# Patient Record
Sex: Male | Born: 1975 | Race: White | Hispanic: No | Marital: Married | Smoking: Heavy tobacco smoker
Health system: Southern US, Community
[De-identification: ages and names within clinical notes are randomized; demographics above are authoritative.]

## PROBLEM LIST (undated history)

## (undated) DIAGNOSIS — F419 Anxiety disorder, unspecified: Secondary | ICD-10-CM

## (undated) DIAGNOSIS — D649 Anemia, unspecified: Secondary | ICD-10-CM

## (undated) DIAGNOSIS — E0590 Thyrotoxicosis, unspecified without thyrotoxic crisis or storm: Secondary | ICD-10-CM

## (undated) DIAGNOSIS — R519 Headache, unspecified: Secondary | ICD-10-CM

## (undated) DIAGNOSIS — Z79899 Other long term (current) drug therapy: Secondary | ICD-10-CM

## (undated) DIAGNOSIS — F32A Depression, unspecified: Secondary | ICD-10-CM

## (undated) DIAGNOSIS — F329 Major depressive disorder, single episode, unspecified: Secondary | ICD-10-CM

## (undated) DIAGNOSIS — E119 Type 2 diabetes mellitus without complications: Secondary | ICD-10-CM

## (undated) DIAGNOSIS — R51 Headache: Secondary | ICD-10-CM

## (undated) DIAGNOSIS — M5136 Other intervertebral disc degeneration, lumbar region: Secondary | ICD-10-CM

## (undated) DIAGNOSIS — Z889 Allergy status to unspecified drugs, medicaments and biological substances status: Secondary | ICD-10-CM

## (undated) DIAGNOSIS — F319 Bipolar disorder, unspecified: Secondary | ICD-10-CM

## (undated) DIAGNOSIS — K219 Gastro-esophageal reflux disease without esophagitis: Secondary | ICD-10-CM

## (undated) DIAGNOSIS — M84376D Stress fracture, unspecified foot, subsequent encounter for fracture with routine healing: Secondary | ICD-10-CM

## (undated) DIAGNOSIS — E669 Obesity, unspecified: Secondary | ICD-10-CM

## (undated) DIAGNOSIS — G8929 Other chronic pain: Secondary | ICD-10-CM

## (undated) DIAGNOSIS — R251 Tremor, unspecified: Secondary | ICD-10-CM

## (undated) DIAGNOSIS — M1990 Unspecified osteoarthritis, unspecified site: Secondary | ICD-10-CM

## (undated) DIAGNOSIS — M797 Fibromyalgia: Secondary | ICD-10-CM

## (undated) DIAGNOSIS — G5620 Lesion of ulnar nerve, unspecified upper limb: Secondary | ICD-10-CM

## (undated) DIAGNOSIS — Z9884 Bariatric surgery status: Secondary | ICD-10-CM

## (undated) DIAGNOSIS — H9190 Unspecified hearing loss, unspecified ear: Secondary | ICD-10-CM

## (undated) DIAGNOSIS — J45909 Unspecified asthma, uncomplicated: Secondary | ICD-10-CM

## (undated) HISTORY — PX: NEUROPLASTY / TRANSPOSITION MEDIAN NERVE AT CARPAL TUNNEL: SUR893

## (undated) HISTORY — PX: HERNIA REPAIR: SHX51

## (undated) HISTORY — PX: OTHER SURGICAL HISTORY: SHX169

## (undated) HISTORY — PX: LAPAROSCOPIC GASTRIC BYPASS: SUR771

---

## 2000-11-08 HISTORY — PX: OTHER SURGICAL HISTORY: SHX169

## 2004-08-11 ENCOUNTER — Ambulatory Visit: Payer: Self-pay | Admitting: Pain Medicine

## 2004-08-19 ENCOUNTER — Ambulatory Visit: Payer: Self-pay | Admitting: Pain Medicine

## 2004-09-29 ENCOUNTER — Ambulatory Visit: Payer: Self-pay | Admitting: Pain Medicine

## 2004-11-25 ENCOUNTER — Ambulatory Visit: Payer: Self-pay | Admitting: Pain Medicine

## 2004-12-17 ENCOUNTER — Ambulatory Visit: Payer: Self-pay | Admitting: Pain Medicine

## 2004-12-23 ENCOUNTER — Ambulatory Visit: Payer: Self-pay | Admitting: Pain Medicine

## 2005-03-15 ENCOUNTER — Ambulatory Visit: Payer: Self-pay | Admitting: Pain Medicine

## 2005-03-22 ENCOUNTER — Ambulatory Visit: Payer: Self-pay | Admitting: Pain Medicine

## 2005-03-29 ENCOUNTER — Ambulatory Visit: Payer: Self-pay | Admitting: Pain Medicine

## 2005-04-26 ENCOUNTER — Ambulatory Visit: Payer: Self-pay | Admitting: Physician Assistant

## 2005-05-24 ENCOUNTER — Ambulatory Visit: Payer: Self-pay | Admitting: Physician Assistant

## 2005-05-27 ENCOUNTER — Ambulatory Visit: Payer: Self-pay | Admitting: Pain Medicine

## 2005-06-03 ENCOUNTER — Ambulatory Visit: Payer: Self-pay | Admitting: Internal Medicine

## 2005-06-25 ENCOUNTER — Ambulatory Visit: Payer: Self-pay | Admitting: Physician Assistant

## 2005-07-21 ENCOUNTER — Ambulatory Visit: Payer: Self-pay | Admitting: Pain Medicine

## 2005-07-26 ENCOUNTER — Ambulatory Visit: Payer: Self-pay | Admitting: Pain Medicine

## 2005-07-29 ENCOUNTER — Encounter: Payer: Self-pay | Admitting: Pain Medicine

## 2005-08-08 ENCOUNTER — Encounter: Payer: Self-pay | Admitting: Pain Medicine

## 2005-09-08 ENCOUNTER — Ambulatory Visit: Payer: Self-pay | Admitting: Pain Medicine

## 2005-10-07 ENCOUNTER — Ambulatory Visit: Payer: Self-pay | Admitting: Physician Assistant

## 2005-10-25 ENCOUNTER — Ambulatory Visit: Payer: Self-pay | Admitting: Physician Assistant

## 2005-10-26 ENCOUNTER — Ambulatory Visit: Payer: Self-pay | Admitting: Internal Medicine

## 2005-11-10 ENCOUNTER — Ambulatory Visit: Payer: Self-pay | Admitting: Internal Medicine

## 2005-12-06 ENCOUNTER — Ambulatory Visit: Payer: Self-pay | Admitting: Physician Assistant

## 2006-01-05 ENCOUNTER — Ambulatory Visit: Payer: Self-pay | Admitting: Physician Assistant

## 2006-02-04 ENCOUNTER — Ambulatory Visit: Payer: Self-pay | Admitting: Physician Assistant

## 2006-02-10 ENCOUNTER — Ambulatory Visit: Payer: Self-pay | Admitting: Pain Medicine

## 2006-03-02 ENCOUNTER — Ambulatory Visit: Payer: Self-pay | Admitting: Physician Assistant

## 2006-04-01 ENCOUNTER — Ambulatory Visit: Payer: Self-pay | Admitting: Physician Assistant

## 2006-05-04 ENCOUNTER — Ambulatory Visit: Payer: Self-pay | Admitting: Physician Assistant

## 2006-05-30 ENCOUNTER — Ambulatory Visit: Payer: Self-pay | Admitting: Physician Assistant

## 2006-07-01 ENCOUNTER — Ambulatory Visit: Payer: Self-pay | Admitting: Physician Assistant

## 2006-08-01 ENCOUNTER — Ambulatory Visit: Payer: Self-pay | Admitting: Physician Assistant

## 2006-09-08 ENCOUNTER — Ambulatory Visit: Payer: Self-pay | Admitting: Physician Assistant

## 2006-09-20 ENCOUNTER — Ambulatory Visit: Payer: Self-pay | Admitting: Physician Assistant

## 2006-10-20 ENCOUNTER — Ambulatory Visit: Payer: Self-pay | Admitting: Physician Assistant

## 2006-11-16 ENCOUNTER — Ambulatory Visit: Payer: Self-pay | Admitting: Pain Medicine

## 2006-12-15 ENCOUNTER — Ambulatory Visit: Payer: Self-pay | Admitting: Physician Assistant

## 2007-01-11 ENCOUNTER — Ambulatory Visit: Payer: Self-pay | Admitting: Physician Assistant

## 2007-02-08 ENCOUNTER — Ambulatory Visit: Payer: Self-pay | Admitting: Physician Assistant

## 2007-04-27 ENCOUNTER — Ambulatory Visit: Payer: Self-pay | Admitting: Physician Assistant

## 2007-06-22 ENCOUNTER — Ambulatory Visit: Payer: Self-pay | Admitting: Physician Assistant

## 2007-07-20 ENCOUNTER — Ambulatory Visit: Payer: Self-pay | Admitting: Physician Assistant

## 2007-08-31 ENCOUNTER — Ambulatory Visit: Payer: Self-pay | Admitting: Physician Assistant

## 2007-09-28 ENCOUNTER — Ambulatory Visit: Payer: Self-pay | Admitting: Physician Assistant

## 2007-11-16 ENCOUNTER — Ambulatory Visit: Payer: Self-pay | Admitting: Physician Assistant

## 2007-12-14 ENCOUNTER — Ambulatory Visit: Payer: Self-pay | Admitting: Physician Assistant

## 2008-01-04 ENCOUNTER — Ambulatory Visit: Payer: Self-pay | Admitting: Pain Medicine

## 2008-01-18 ENCOUNTER — Ambulatory Visit: Payer: Self-pay | Admitting: Physician Assistant

## 2008-03-28 ENCOUNTER — Ambulatory Visit: Payer: Self-pay | Admitting: Physician Assistant

## 2008-06-27 ENCOUNTER — Ambulatory Visit: Payer: Self-pay | Admitting: Physician Assistant

## 2008-08-01 ENCOUNTER — Ambulatory Visit: Payer: Self-pay | Admitting: Physician Assistant

## 2008-09-11 ENCOUNTER — Ambulatory Visit: Payer: Self-pay | Admitting: Pain Medicine

## 2008-11-08 HISTORY — PX: BARIATRIC SURGERY: SHX1103

## 2008-12-05 ENCOUNTER — Ambulatory Visit: Payer: Self-pay | Admitting: Physician Assistant

## 2010-03-15 ENCOUNTER — Emergency Department: Payer: Self-pay | Admitting: Emergency Medicine

## 2010-11-08 HISTORY — PX: VASCULAR SURGERY: SHX849

## 2012-04-02 ENCOUNTER — Emergency Department: Payer: Self-pay | Admitting: Emergency Medicine

## 2016-02-09 HISTORY — PX: ESOPHAGOGASTRODUODENOSCOPY: SHX1529

## 2016-06-21 HISTORY — PX: COLONOSCOPY: SHX174

## 2017-04-21 HISTORY — PX: KNEE ARTHROSCOPY: SUR90

## 2018-08-03 ENCOUNTER — Other Ambulatory Visit: Payer: Self-pay | Admitting: Gastroenterology

## 2018-08-03 DIAGNOSIS — R1310 Dysphagia, unspecified: Secondary | ICD-10-CM

## 2018-08-03 DIAGNOSIS — R110 Nausea: Secondary | ICD-10-CM

## 2018-08-03 DIAGNOSIS — R1013 Epigastric pain: Secondary | ICD-10-CM

## 2018-08-03 DIAGNOSIS — R1011 Right upper quadrant pain: Secondary | ICD-10-CM

## 2018-08-07 ENCOUNTER — Other Ambulatory Visit: Payer: Self-pay | Admitting: Gastroenterology

## 2018-08-07 DIAGNOSIS — R1011 Right upper quadrant pain: Secondary | ICD-10-CM

## 2018-09-07 ENCOUNTER — Ambulatory Visit: Payer: Self-pay

## 2018-09-07 ENCOUNTER — Ambulatory Visit
Admission: RE | Admit: 2018-09-07 | Discharge: 2018-09-07 | Disposition: A | Discharge status: Home / Self Care | Payer: 59 | Source: Ambulatory Visit | Attending: Gastroenterology | Admitting: Gastroenterology

## 2018-09-07 ENCOUNTER — Encounter
Admission: RE | Admit: 2018-09-07 | Discharge: 2018-09-07 | Disposition: A | Discharge status: Home / Self Care | Payer: 59 | Source: Ambulatory Visit | Attending: Gastroenterology | Admitting: Gastroenterology

## 2018-09-07 DIAGNOSIS — R1011 Right upper quadrant pain: Secondary | ICD-10-CM

## 2018-09-07 DIAGNOSIS — R1310 Dysphagia, unspecified: Secondary | ICD-10-CM | POA: Diagnosis not present

## 2018-09-07 DIAGNOSIS — K8020 Calculus of gallbladder without cholecystitis without obstruction: Secondary | ICD-10-CM | POA: Insufficient documentation

## 2018-09-07 DIAGNOSIS — R1013 Epigastric pain: Secondary | ICD-10-CM | POA: Diagnosis not present

## 2018-09-07 DIAGNOSIS — R110 Nausea: Secondary | ICD-10-CM | POA: Insufficient documentation

## 2018-09-07 DIAGNOSIS — K219 Gastro-esophageal reflux disease without esophagitis: Secondary | ICD-10-CM | POA: Insufficient documentation

## 2018-09-20 ENCOUNTER — Encounter: Payer: Self-pay | Admitting: *Deleted

## 2018-09-21 ENCOUNTER — Ambulatory Visit
Admission: RE | Admit: 2018-09-21 | Discharge: 2018-09-21 | Disposition: A | Discharge status: Home / Self Care | Payer: 59 | Source: Ambulatory Visit | Attending: Gastroenterology | Admitting: Gastroenterology

## 2018-09-21 ENCOUNTER — Ambulatory Visit: Payer: 59 | Admitting: Anesthesiology

## 2018-09-21 ENCOUNTER — Encounter
Admission: RE | Disposition: A | Discharge status: Home / Self Care | Payer: Self-pay | Source: Ambulatory Visit | Attending: Gastroenterology

## 2018-09-21 DIAGNOSIS — M797 Fibromyalgia: Secondary | ICD-10-CM | POA: Insufficient documentation

## 2018-09-21 DIAGNOSIS — F319 Bipolar disorder, unspecified: Secondary | ICD-10-CM | POA: Insufficient documentation

## 2018-09-21 DIAGNOSIS — K319 Disease of stomach and duodenum, unspecified: Secondary | ICD-10-CM | POA: Diagnosis not present

## 2018-09-21 DIAGNOSIS — K2970 Gastritis, unspecified, without bleeding: Secondary | ICD-10-CM | POA: Insufficient documentation

## 2018-09-21 DIAGNOSIS — Z9884 Bariatric surgery status: Secondary | ICD-10-CM | POA: Diagnosis not present

## 2018-09-21 DIAGNOSIS — K210 Gastro-esophageal reflux disease with esophagitis: Secondary | ICD-10-CM | POA: Insufficient documentation

## 2018-09-21 DIAGNOSIS — K449 Diaphragmatic hernia without obstruction or gangrene: Secondary | ICD-10-CM | POA: Diagnosis not present

## 2018-09-21 DIAGNOSIS — Z7984 Long term (current) use of oral hypoglycemic drugs: Secondary | ICD-10-CM | POA: Diagnosis not present

## 2018-09-21 DIAGNOSIS — K2210 Ulcer of esophagus without bleeding: Secondary | ICD-10-CM | POA: Insufficient documentation

## 2018-09-21 DIAGNOSIS — M5136 Other intervertebral disc degeneration, lumbar region: Secondary | ICD-10-CM | POA: Diagnosis not present

## 2018-09-21 DIAGNOSIS — M1990 Unspecified osteoarthritis, unspecified site: Secondary | ICD-10-CM | POA: Diagnosis not present

## 2018-09-21 DIAGNOSIS — G4730 Sleep apnea, unspecified: Secondary | ICD-10-CM | POA: Diagnosis not present

## 2018-09-21 DIAGNOSIS — F419 Anxiety disorder, unspecified: Secondary | ICD-10-CM | POA: Insufficient documentation

## 2018-09-21 DIAGNOSIS — J45909 Unspecified asthma, uncomplicated: Secondary | ICD-10-CM | POA: Insufficient documentation

## 2018-09-21 DIAGNOSIS — E669 Obesity, unspecified: Secondary | ICD-10-CM | POA: Diagnosis not present

## 2018-09-21 DIAGNOSIS — F17210 Nicotine dependence, cigarettes, uncomplicated: Secondary | ICD-10-CM | POA: Insufficient documentation

## 2018-09-21 DIAGNOSIS — E0590 Thyrotoxicosis, unspecified without thyrotoxic crisis or storm: Secondary | ICD-10-CM | POA: Insufficient documentation

## 2018-09-21 DIAGNOSIS — G8929 Other chronic pain: Secondary | ICD-10-CM | POA: Insufficient documentation

## 2018-09-21 DIAGNOSIS — Z6833 Body mass index (BMI) 33.0-33.9, adult: Secondary | ICD-10-CM | POA: Diagnosis not present

## 2018-09-21 DIAGNOSIS — Z7951 Long term (current) use of inhaled steroids: Secondary | ICD-10-CM | POA: Insufficient documentation

## 2018-09-21 DIAGNOSIS — K224 Dyskinesia of esophagus: Secondary | ICD-10-CM | POA: Insufficient documentation

## 2018-09-21 DIAGNOSIS — Z882 Allergy status to sulfonamides status: Secondary | ICD-10-CM | POA: Diagnosis not present

## 2018-09-21 DIAGNOSIS — E1140 Type 2 diabetes mellitus with diabetic neuropathy, unspecified: Secondary | ICD-10-CM | POA: Diagnosis not present

## 2018-09-21 DIAGNOSIS — R1013 Epigastric pain: Secondary | ICD-10-CM | POA: Diagnosis present

## 2018-09-21 HISTORY — DX: Major depressive disorder, single episode, unspecified: F32.9

## 2018-09-21 HISTORY — DX: Fibromyalgia: M79.7

## 2018-09-21 HISTORY — DX: Depression, unspecified: F32.A

## 2018-09-21 HISTORY — DX: Unspecified asthma, uncomplicated: J45.909

## 2018-09-21 HISTORY — DX: Gastro-esophageal reflux disease without esophagitis: K21.9

## 2018-09-21 HISTORY — DX: Stress fracture, unspecified foot, subsequent encounter for fracture with routine healing: M84.376D

## 2018-09-21 HISTORY — DX: Obesity, unspecified: E66.9

## 2018-09-21 HISTORY — DX: Headache: R51

## 2018-09-21 HISTORY — DX: Bariatric surgery status: Z98.84

## 2018-09-21 HISTORY — DX: Other intervertebral disc degeneration, lumbar region: M51.36

## 2018-09-21 HISTORY — DX: Other chronic pain: G89.29

## 2018-09-21 HISTORY — DX: Bipolar disorder, unspecified: F31.9

## 2018-09-21 HISTORY — DX: Lesion of ulnar nerve, unspecified upper limb: G56.20

## 2018-09-21 HISTORY — DX: Anemia, unspecified: D64.9

## 2018-09-21 HISTORY — DX: Allergy status to unspecified drugs, medicaments and biological substances: Z88.9

## 2018-09-21 HISTORY — DX: Tremor, unspecified: R25.1

## 2018-09-21 HISTORY — DX: Type 2 diabetes mellitus without complications: E11.9

## 2018-09-21 HISTORY — DX: Other long term (current) drug therapy: Z79.899

## 2018-09-21 HISTORY — DX: Unspecified hearing loss, unspecified ear: H91.90

## 2018-09-21 HISTORY — DX: Thyrotoxicosis, unspecified without thyrotoxic crisis or storm: E05.90

## 2018-09-21 HISTORY — DX: Anxiety disorder, unspecified: F41.9

## 2018-09-21 HISTORY — PX: ESOPHAGOGASTRODUODENOSCOPY (EGD) WITH PROPOFOL: SHX5813

## 2018-09-21 HISTORY — DX: Unspecified osteoarthritis, unspecified site: M19.90

## 2018-09-21 HISTORY — DX: Headache, unspecified: R51.9

## 2018-09-21 SURGERY — ESOPHAGOGASTRODUODENOSCOPY (EGD) WITH PROPOFOL
Anesthesia: General

## 2018-09-21 MED ORDER — FENTANYL CITRATE (PF) 100 MCG/2ML IJ SOLN
INTRAMUSCULAR | Status: AC
  Filled 2018-09-21: qty 2

## 2018-09-21 MED ORDER — EPHEDRINE SULFATE 50 MG/ML IJ SOLN
INTRAMUSCULAR | Status: AC
  Filled 2018-09-21: qty 1

## 2018-09-21 MED ORDER — PROPOFOL 500 MG/50ML IV EMUL
INTRAVENOUS | Status: AC
  Filled 2018-09-21: qty 50

## 2018-09-21 MED ORDER — MIDAZOLAM HCL 2 MG/2ML IJ SOLN
INTRAMUSCULAR | Status: AC
  Filled 2018-09-21: qty 2

## 2018-09-21 MED ORDER — MIDAZOLAM HCL 2 MG/2ML IJ SOLN
INTRAMUSCULAR | Status: DC | PRN
  Administered 2018-09-21: 2 mg via INTRAVENOUS

## 2018-09-21 MED ORDER — FENTANYL CITRATE (PF) 100 MCG/2ML IJ SOLN
INTRAMUSCULAR | Status: DC | PRN
  Administered 2018-09-21: 50 ug via INTRAVENOUS

## 2018-09-21 MED ORDER — SODIUM CHLORIDE 0.9 % IV SOLN
INTRAVENOUS | Status: DC
  Administered 2018-09-21: 09:00:00 via INTRAVENOUS

## 2018-09-21 MED ORDER — EPHEDRINE SULFATE 50 MG/ML IJ SOLN
INTRAMUSCULAR | Status: DC | PRN
  Administered 2018-09-21: 10 mg via INTRAVENOUS
  Administered 2018-09-21: 5 mg via INTRAVENOUS

## 2018-09-21 MED ORDER — PROPOFOL 500 MG/50ML IV EMUL
INTRAVENOUS | Status: DC | PRN
  Administered 2018-09-21: 120 ug/kg/min via INTRAVENOUS

## 2018-09-21 MED ORDER — PHENYLEPHRINE HCL 10 MG/ML IJ SOLN
INTRAMUSCULAR | Status: DC | PRN
  Administered 2018-09-21: 100 ug via INTRAVENOUS
  Administered 2018-09-21 (×2): 50 ug via INTRAVENOUS

## 2018-09-21 MED ORDER — SODIUM CHLORIDE 0.9 % IV SOLN: INTRAVENOUS | Status: DC

## 2018-09-21 MED ORDER — LIDOCAINE HCL (CARDIAC) PF 100 MG/5ML IV SOSY
PREFILLED_SYRINGE | INTRAVENOUS | Status: DC | PRN
  Administered 2018-09-21: 30 mg via INTRAVENOUS

## 2018-09-21 MED ORDER — PHENYLEPHRINE HCL 10 MG/ML IJ SOLN
INTRAMUSCULAR | Status: AC
  Filled 2018-09-21: qty 1

## 2018-09-21 MED ORDER — LIDOCAINE HCL (PF) 2 % IJ SOLN
INTRAMUSCULAR | Status: AC
  Filled 2018-09-21: qty 10

## 2018-09-21 MED ORDER — BUTAMBEN-TETRACAINE-BENZOCAINE 2-2-14 % EX AERO
INHALATION_SPRAY | CUTANEOUS | Status: AC
  Filled 2018-09-21: qty 5

## 2018-09-21 NOTE — Anesthesia Postprocedure Evaluation (Signed)
Anesthesia Post Note  Patient: Spencer Rivera  Procedure(s) Performed: ESOPHAGOGASTRODUODENOSCOPY (EGD) WITH PROPOFOL (N/A )  Patient location during evaluation: Endoscopy Anesthesia Type: General Level of consciousness: awake and alert Pain management: pain level controlled Vital Signs Assessment: post-procedure vital signs reviewed and stable Respiratory status: spontaneous breathing, nonlabored ventilation, respiratory function stable and patient connected to nasal cannula oxygen Cardiovascular status: blood pressure returned to baseline and stable Postop Assessment: no apparent nausea or vomiting Anesthetic complications: no     Last Vitals:  Vitals:   09/21/18 1007 09/21/18 1017  BP: 103/61 106/78  Pulse: 89   Resp: 12   Temp: (!) 36.3 C   SpO2:      Last Pain:  Vitals:   09/21/18 1027  TempSrc:   PainSc: 0-No pain                 Shahram Alexopoulos S

## 2018-09-21 NOTE — Anesthesia Post-op Follow-up Note (Signed)
Anesthesia QCDR form completed.        

## 2018-09-21 NOTE — H&P (Signed)
Outpatient short stay form Pre-procedure 09/21/2018 9:25 AM Lollie Sails MD  Primary Physician: Dr. Norman Clay, MD  Reason for visit: EGD  History of present illness: Patient is a 42 year old male presenting today for an EGD in regards to a history of dysphagia and nausea with right upper quadrant pain.  Had a gastric bypass in the past in 2010 and did lose about 200 pounds of weight.  He has been taking a PPI as well as Carafate without much improvement of symptoms.  He did have a abdominal ultrasound that showed multiple gallstones as well as sludge.  He is currently taking rabeprazole.  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  Does have issues with constipation this improved however with Linzess 165mg.  Review of his medication indicates several medications it could be issues with his swallowing including MS Contin and MSIR sildenafil trazadone and verapamil.  Barium swallow indicated no difficulty passing the standardized pill and there is no evidence of a fixed stricture.  Patient has a history of thrombocytopenia however his most recent platelet count was 140 on 06/08/2018, this being relatively stable for about a year.    Current Facility-Administered Medications:  .  0.9 %  sodium chloride infusion, , Intravenous, Continuous, SLollie Sails MD .  0.9 %  sodium chloride infusion, , Intravenous, Continuous, SLollie Sails MD  Medications Prior to Admission  Medication Sig Dispense Refill Last Dose  . ALBUTEROL IN Inhale into the lungs as needed.   Past Week at Unknown time  . azelastine (ASTELIN) 0.1 % nasal spray Place into both nostrils 2 (two) times daily. Use in each nostril as directed   09/20/2018 at Unknown time  . Cetirizine-Pseudoephedrine (ZYRTEC-D PO) Take 5-120 mg by mouth daily.   Past Month at Unknown time  . ciclopirox (PENLAC) 8 % solution Apply topically at bedtime. Apply over nail and  surrounding skin. Apply daily over previous coat. After seven (7) days, may remove with alcohol and continue cycle.     . clotrimazole-betamethasone (LOTRISONE) cream Apply 1 application topically 2 (two) times daily.     . cyclobenzaprine (FLEXERIL) 10 MG tablet Take 10 mg by mouth 3 (three) times daily as needed for muscle spasms.     .Marland Kitchendesonide (DESOWEN) 0.05 % cream Apply topically 2 (two) times daily.     .Marland KitchenDEXMETHYLPHENIDATE HCL PO Take 30 mg by mouth 2 (two) times daily.     . diclofenac (VOLTAREN) 50 MG EC tablet Take 50 mg by mouth 2 (two) times daily.   09/21/2018 at Unknown time  . docusate sodium (COLACE) 100 MG capsule Take 100 mg by mouth 2 (two) times daily.   09/20/2018 at Unknown time  . DULoxetine (CYMBALTA) 60 MG capsule Take 60 mg by mouth 2 (two) times daily.   09/21/2018 at Unknown time  . ergocalciferol (VITAMIN D2) 1.25 MG (50000 UT) capsule Take 50,000 Units by mouth once a week.   09/19/2018  . ferrous sulfate 325 (65 FE) MG tablet Take 325 mg by mouth 2 (two) times daily.     .Marland Kitchenketotifen (ZADITOR) 0.025 % ophthalmic solution Place 1 drop into both eyes 3 (three) times daily.     .Marland KitchenLACTULOSE PO Take 15 mg by mouth.     . levETIRAcetam (KEPPRA) 500 MG tablet Take 500 mg by mouth 2 (two) times daily.   09/21/2018 at Unknown time  . linaclotide (LINZESS) 145 MCG  CAPS capsule Take 145 mcg by mouth daily before breakfast.   09/21/2018 at Unknown time  . metFORMIN (GLUCOPHAGE) 500 MG tablet Take by mouth 2 (two) times daily with a meal.   09/20/2018 at Unknown time  . morphine (MSIR) 30 MG tablet Take 30 mg by mouth every 12 (twelve) hours.     . mupirocin ointment (BACTROBAN) 2 % Place 1 application into the nose daily.     . Naloxone HCl (NARCAN NA) Place 4 mg into the nose.     . nystatin (NYSTATIN) powder Apply topically as needed.     . ondansetron (ZOFRAN) 4 MG tablet Take 4 mg by mouth as needed for nausea or vomiting.     Marland Kitchen QUEtiapine (SEROQUEL) 100 MG tablet Take 100  mg by mouth as needed.   09/20/2018 at Unknown time  . RABEprazole (ACIPHEX) 20 MG tablet Take 20 mg by mouth daily.   09/20/2018 at Unknown time  . sildenafil (VIAGRA) 100 MG tablet Take 100 mg by mouth daily as needed for erectile dysfunction.     . sucralfate (CARAFATE) 1 g tablet Take 1 g by mouth 4 (four) times daily -  with meals and at bedtime.     . SUMAtriptan (IMITREX) 100 MG tablet Take 100 mg by mouth every 2 (two) hours as needed for migraine. May repeat in 2 hours if headache persists or recurs.   Past Month at Unknown time  . traZODone (DESYREL) 100 MG tablet Take 100 mg by mouth at bedtime.   09/20/2018 at Unknown time  . triamcinolone (NASACORT) 55 MCG/ACT AERO nasal inhaler Place 2 sprays into the nose as needed.   09/21/2018 at Unknown time  . verapamil (VERELAN PM) 240 MG 24 hr capsule Take 240 mg by mouth at bedtime.   09/20/2018 at Unknown time  . zaleplon (SONATA) 10 MG capsule Take 10 mg by mouth at bedtime as needed for sleep.   09/20/2018 at Unknown time  . zolmitriptan (ZOMIG) 5 MG tablet Take 5 mg by mouth as needed for migraine.   Past Month at Unknown time  . zolpidem (AMBIEN CR) 12.5 MG CR tablet Take 12.5 mg by mouth at bedtime as needed for sleep.        Allergies  Allergen Reactions  . Belsomra [Suvorexant] Anaphylaxis    Hallucinations   . Ciprofloxacin   . Ipratropium Swelling  . Lactose Intolerance (Gi)   . Lamotrigine   . Topiramate   . Bactrim [Sulfamethoxazole-Trimethoprim] Rash  . Etodolac Itching, Swelling and Rash  . Latex Rash  . Methocarbamol Itching, Swelling and Rash  . Naproxen Sodium Rash  . Paroxetine Hcl Itching, Swelling and Rash  . Prednisone Hives, Itching, Swelling and Rash  . Pregabalin Rash  . Trintellix [Vortioxetine] Anxiety     Past Medical History:  Diagnosis Date  . Allergic genetic state   . Anemia   . Anxiety   . Arthritis   . Asthma   . Bipolar disorder ([place])   . Chronic pain   . DDD (degenerative disc  disease), lumbar   . Depression   . Diabetes mellitus without complication ([place])   . Fibromyalgia   . GERD (gastroesophageal reflux disease)   . H/O gastric bypass   . Headache   . Hearing loss   . Obesity   . Polypharmacy   . Sleep apnea   . Stress fracture of metatarsal bone with routine healing   . Thyrotoxicosis   . Tremor   .  Ulnar neuropathy     Review of systems:      Physical Exam    Heart and lungs: Regular rate and rhythm without rub or gallop, lungs are bilaterally clear.    HEENT: Normocephalic atraumatic eyes are anicteric    Other:    Pertinant exam for procedure: Soft positive tender to palpation in the right upper quadrant and somewhat less so in the epigastric region.  There are no masses or rebound.  Bowel sounds positive and normoactive.    Planned proceedures: EGD and indicated procedures. I have discussed the risks benefits and complications of procedures to include not limited to bleeding, infection, perforation and the risk of sedation and the patient wishes to proceed.    Lollie Sails, MD Gastroenterology 09/21/2018  9:25 AM

## 2018-09-21 NOTE — Anesthesia Procedure Notes (Signed)
Performed by: Cook-Martin, Shadrack Brummitt Pre-anesthesia Checklist: Patient identified, Emergency Drugs available, Suction available, Patient being monitored and Timeout performed Patient Re-evaluated:Patient Re-evaluated prior to induction Oxygen Delivery Method: Nasal cannula Preoxygenation: Pre-oxygenation with 100% oxygen Induction Type: IV induction Placement Confirmation: positive ETCO2 and CO2 detector       

## 2018-09-21 NOTE — Transfer of Care (Signed)
2323Immediate Anesthesia Transfer of Care Note  Patient: Spencer Rivera  Procedure(s) Performed: ESOPHAGOGASTRODUODENOSCOPY (EGD) WITH PROPOFOL (N/A )  Patient Location: PACU  Anesthesia Type:General  Level of Consciousness: awake and sedated  Airway & Oxygen Therapy: Patient Spontanous Breathing and Patient connected to nasal cannula oxygen  Post-op Assessment: Report given to RN and Post -op Vital signs reviewed and stable  Post vital signs: Reviewed and stable  Last Vitals:  Vitals Value Taken Time  BP    Temp    Pulse    Resp    SpO2      Last Pain:  Vitals:   09/21/18 0910  TempSrc: Tympanic  PainSc: 0-No pain         Complications: No apparent anesthesia complications

## 2018-09-21 NOTE — Op Note (Signed)
[facility] Gastroenterology Patient Name: Spencer Rivera Procedure Date: 09/21/2018 9:10 AM MRN: [id number] Account #: [id number] Date of Birth: [birth year] Admit Type: Outpatient Age: 42 Room: [facility] ENDO ROOM 1 Gender: Male Note Status: Finalized Procedure:            Upper GI endoscopy Indications:          Epigastric abdominal pain, Abdominal pain in the right                        upper quadrant, Dysphagia Providers:            Lollie Sails, MD Referring MD:         Bo Mcclintock. Vickki Muff (Referring MD) Medicines:            Monitored Anesthesia Care Complications:        No immediate complications. Mild oropharyngeal bleeding                        noted from suctioning. Good hemostasis noted. Procedure:            Pre-Anesthesia Assessment:                       - ASA Grade Assessment: III - A patient with severe                        systemic disease.                       After obtaining informed consent, the endoscope was                        passed under direct vision. Throughout the procedure,                        the patient's blood pressure, pulse, and oxygen                        saturations were monitored continuously. The Endoscope                        was introduced through the mouth, and advanced to the                        afferent and efferent jejunal loops. The upper GI                        endoscopy was accomplished without difficulty. The                        patient tolerated the procedure. Findings:      Atypical erosive/ulcerative esophagitis with no bleeding was found 40 cm       from the incisors. MUltiple small erosions with a cap on some. Biopsies       were taken with a cold forceps for histology.      The Z-line was regular and was found 42 cm from the incisors. This is       non-inflamed.      Evidence of a Roux-en-Y gastrojejunostomy was found.      the pouch is 7 cm. The gastrojejunal anastomosis was  characterized by  healthy appearing mucosa. This was traversed. The pouch-to-jejunum limb       was characterized by healthy appearing mucosa. The jejunojejunal       anastomosis was characterized by healthy appearing mucosa. [place] the       efferent and afferent limb was intubated, and seen to also be healthy in       appearance.      Diffuse mild inflammation characterized by congestion (edema) and       erythema, friable to touch, was found in the gastric body. Biopsies were       taken with a cold forceps for histology. Biopsies were taken with a cold       forceps for Helicobacter pylori testing. Impression:           - Atypical erosive/ulcerative esophagitis. Biopsied.                       - Z-line regular, 42 cm from the incisors.                       - Roux-en-Y gastrojejunostomy with gastrojejunal                        anastomosis characterized by healthy appearing mucosa.                       - Gastritis. Biopsied. Recommendation:       - Use Aciphex (rabeprazole) 20 mg PO daily daily.                       - Await pathology results.                       - Continue with appointment for surgical opinion of                        possible chronic cholecystitis.                       - Return to GI clinic in 6 weeks. Lollie Sails, MD 09/21/2018 10:11:47 AM This report has been signed electronically. Number of Addenda: 0 Note Initiated On: 09/21/2018 9:10 AM      [facility]

## 2018-09-21 NOTE — Anesthesia Preprocedure Evaluation (Signed)
Anesthesia Evaluation  Patient identified by MRN, date of birth, ID band Patient awake    Reviewed: Allergy & Precautions, NPO status , Patient's Chart, lab work & pertinent test results, reviewed documented beta blocker date and time   Airway Mallampati: II  TM Distance: >3 FB     Dental  (+) Chipped, Missing   Pulmonary asthma , Current Smoker,           Cardiovascular      Neuro/Psych  Headaches, PSYCHIATRIC DISORDERS Anxiety Depression Bipolar Disorder  Neuromuscular disease    GI/Hepatic GERD  ,  Endo/Other  diabetes, Type 2Hyperthyroidism   Renal/GU      Musculoskeletal  (+) Arthritis , Fibromyalgia -  Abdominal   Peds  Hematology  (+) anemia ,   Anesthesia Other Findings Obese. Gastric bypass.  Reproductive/Obstetrics                             Anesthesia Physical Anesthesia Plan  ASA: II  Anesthesia Plan: General   Post-op Pain Management:    Induction: Intravenous  PONV Risk Score and Plan:   Airway Management Planned:   Additional Equipment:   Intra-op Plan:   Post-operative Plan:   Informed Consent: I have reviewed the patients History and Physical, chart, labs and discussed the procedure including the risks, benefits and alternatives for the proposed anesthesia with the patient or authorized representative who has indicated his/her understanding and acceptance.     Plan Discussed with: CRNA  Anesthesia Plan Comments:         Anesthesia Quick Evaluation

## 2018-09-22 ENCOUNTER — Encounter: Payer: Self-pay | Admitting: Gastroenterology

## 2018-09-22 LAB — SURGICAL PATHOLOGY

## 2018-09-28 ENCOUNTER — Ambulatory Visit: Payer: Self-pay | Admitting: General Surgery

## 2018-09-28 NOTE — H&P (Signed)
PATIENT PROFILE: Spencer Rivera is a 42 y.o. male who presents to the Clinic for consultation at the request of Dr. Ellin Mayhew for evaluation of cholelithiasis.  PCP:  Celesta Gentile, MD  HISTORY OF PRESENT ILLNESS: Spencer Rivera reports having epigastric pain since 6 months ago. The patient refers pain starts on the epigastrica area and radiates to both right and left upper quadrants and to the periumbilical area. Pain is aggravated with eating. Pain starts all the time after eating with every meal. Pain goes away by itself. Patient take chronically morphine that does not improves or prevent this pain. Pain is associated with nausea but no vomiting. Patient denies fever or chills.   Patient previously evaluated by GI service that has treated patient for reflux and constipation. Patient had recent upper GI endoscopy and was found with esophagitis and gastritis that are not new finding. This has been controlled with Aciphex.    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   Overview    Anoscopy 07/2017 Dr Gwinda Maine Needs annual follow up in Anorectal surgery clinic [facility]      Type 2 diabetes mellitus without complication (CMS-HCC) 5/00/9381   Drainage from wound 04/28/2017   Degenerative tear of lateral meniscus of right knee 03/28/2017   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  Etiology: likely malabsorption secondary to Gastric bypass B.  Documented since 11/27/11 in [facility] records       1) Last normal H/H documented:12/26/15       2) Hb decrease from 14.1 g/dL in 12/26/15 to 13.2 g/dL in 01/07/16           -MCV unchanged           -no reticulocyte count documented        3) No change in WBC or Platelet count       4) No history of epistaxis, GI bleeding, vaginal bleeding or           Hematuria       5) No history of blood donation C.  Symptomatic history:         1) describes chronic fatigue, dyspnea on exertion, SOB       2) no PICA D.  Diagnostic studies:        1) Procedural studies:             A) Last colonoscopy/findings: Scheduled for 06/2016            B) Last EGD/findings: 02/09/16, unremarkable.       2) Laboratory evaluation  A) last iron studies, 01/07/16: Ferritin=12, TIBC=364, and serum iron=78 E.  [facility] hematology evaluation 05/17/16      1) CBC: WBC= 8.1       H/H= 12.5/38.2         Plt=161      2) Normal studies: CMP, iron studies, TIBC, reticulocytes      3) Abnormal studies: CBC      4) peripheral blood film: Unremarkable.      5) Recommendation: Continue oral iron supplements daily. F.  Treatment history:        1) Transfusions: none.       2)  Iron supplements: ferrous sulfate daily. 02/03/2017: ferrous sulfate BID       3)  Parenteral iron dextran treatments: none.      Polypharmacy 05/14/2016   Overview    Multiple medications for pain, sleep, mood disorder, and headaches. Followed by multiple specialists      Chronic midline low back pain with bilateral sciatica 02/17/2016   Bilateral arm pain 05/23/2015   Ulnar neuropathy of both upper extremities 01/14/2015    Anal dysplasia 10/28/2014   Encounter for monitoring opioid maintenance therapy 10/08/2014   Hearing loss, sensorineural 09/11/2014   DDD (degenerative disc disease), lumbar 03/13/2014   Spinal stenosis 03/13/2014   Chronic migraine 03/13/2014   Chronic, continuous use of opioids 03/13/2014   Radiculopathy 03/13/2014   Back pain 03/13/2014   Bipolar affective disorder (CMS-HCC) 03/13/2014   Splenomegaly 03/07/2014   Depression 11/14/2013   Insomnia 06/19/2013   Overview    Overview:  Struggling w insomnia. Seeing neurology, put on supplements. Source unknown, possible CFS.   Last Assessment & Plan:  Continue f/u sleep specialist Overview:  Struggling w insomnia. Seeing neurology, put on supplements. Source unknown, possible CFS.   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  Immunotherapy resumed in 2017.      Myoclonus 12/20/2011   Thyrotoxicosis, unspecified 12/17/2011   Overview    Overview:  pt denies changes in BM, tremor, sweating, skin changes, palpitations      Thyrotoxicosis without thyroid storm 12/17/2011   Overview    Overview:  pt denies changes in BM, tremor, sweating, skin changes, palpitations      Recurrent major depressive disorder (CMS-HCC) 11/09/2011   Overview    Last Assessment & Plan:  Continues w psych 45mns q2 weeks. Psych has recommended longer sessions more frequently. Stressors continue w sister      Migraine without status migrainosus, not intractable 09/18/2011   Overview    Last Assessment & Plan:  Re migraines, had nasal catheter with lidocaine today.  Plan Cont f/u w HA specialist      Thrombocytopenia (CMS-HCC) 07/19/2011   Overview     Overview:  easy bruising w mildly decreased plt count 9/12      Obesity (BMI 30-39.9), unspecified 07/17/2011   Overview    Overview:  #weight  - was down to 220lbs and med change caused wt gain. Goal was 200lbs, now at 258lbs. Wants to look healthy. Has to make himself eat, low appetite.   reads labels, eats too much pasta but bakes food at home, chicken, fish, pork, not much beef.  Last Assessment &  Plan:  HAS GAINED wt while working at home. Currently focused on sleep mgt, psych stressors.  Plan: Advised that cah resources (rd, cm) are available to him at any time, offered support Overview:  #weight  - was down to 220lbs and med change caused wt gain. Goal was 200lbs, now at 258lbs. Wants to look healthy. Has to make himself eat, low appetite.   reads labels, eats too much pasta but bakes food at home, chicken, fish, pork, not much beef.  Last Assessment & Plan:  HAS GAINED wt while working at home. Currently focused on sleep mgt, psych stressors.  Plan: Advised that cah resources (rd, cm) are available to him at any time, offered support      Sleep apnea 07/17/2011   Overview    Overview:  severe, improved after surgery, now positional. Has new machine and has been sleeping through the night Overview:  severe, improved after surgery, now positional. Has new machine and has been sleeping through the night      Tobacco use disorder 07/17/2011   Overview    Overview:  long time smoker. stopped for bariatric surgery, then restarted w stressors. started wellbutrin 9/12 Overview:  long time smoker. stopped for bariatric surgery, then restarted w stressors. started wellbutrin 9/12      Dorsalgia 07/17/2011   Overview    Overview:  considering surgically implanted stimulator  Last Assessment & Plan:  Re back, has had injections, radiofrequency.  Plan: Cont f/u w pain specialist      Candidiasis of skin and nail 07/17/2011   Overview     Overview:  under stomach, excess skin s/p bypass. using clotrimazole w some improvement         GENERAL REVIEW OF SYSTEMS:   General ROS: negative for - chills, fatigue, weight gain or weight loss. Positive for fatigue Allergy and Immunology ROS: negative for - hives  Hematological and Lymphatic ROS: negative for - bleeding problems or bruising, negative for palpable nodes Endocrine ROS: negative for - heat or cold intolerance, hair changes Respiratory ROS: negative for - cough, shortness of breath or wheezing Cardiovascular ROS: no chest pain or palpitations GI ROS: positive for nausea, vomiting, abdominal pain, constipation. Negative for diarrhea Musculoskeletal ROS: positive for - joint swelling or muscle pain Neurological ROS: negative for - confusion, syncope Dermatological ROS: positive for pruritus and rash Psychiatric: positive for anxiety, difficulty sleeping and memory loss  MEDICATIONS: CurrentMedications        Current Outpatient Medications  Medication Sig Dispense Refill  . albuterol (PROVENTIL) 2.5 mg /3 mL (0.083 %) nebulizer solution Take 2.5 mg by nebulization every 4 (four) hours as needed for Wheezing.    Marland Kitchen albuterol (VENTOLIN HFA) 90 mcg/actuation inhaler Inhale 2 inhalations into the lungs every 4 (four) hours as needed for Wheezing 1 Inhaler 5  . azelastine (ASTELIN) 137 mcg nasal spray Place 1 spray into both nostrils 2 (two) times daily as needed    12  . ciclopirox (PENLAC) 8 % topical nail solution Apply topically nightly as needed Apply over nail and surrounding skin.     . clotrimazole-betamethasone (LOTRISONE) 1-0.05 % cream Apply topically continuously as needed    6  . cyclobenzaprine (FLEXERIL) 10 MG tablet Take 1 to 2 po tid prn muscle spasm (Patient taking differently: Take 10 mg by mouth 3 (three) times daily Take 1 to 2 po tid prn muscle spasm ) 360 tablet 1  . dexmethylphenidate (FOCALIN XR) 30 mg XR capsule Take 60 mg  by mouth every  morning    0  . diclofenac (VOLTAREN) 50 MG EC tablet Take 1 tablet (50 mg total) by mouth 2 (two) times daily with meals 180 tablet 1  . DOK 100 mg capsule TAKE ONE CAPSULE BY MOUTH TWICE DAILY (Patient taking differently: Take 100 mg by mouth 2 (two) times daily  ) 60 capsule 5  . DULoxetine (CYMBALTA) 60 MG DR capsule Take 1 capsule (60 mg total) by mouth 2 (two) times daily (Patient taking differently: Take 120 mg by mouth nightly  ) 180 capsule 3  . ergocalciferol, vitamin D2, 50,000 unit capsule Take 1 capsule (50,000 Units total) by mouth once a week 12 capsule 0  . ferrous sulfate 325 (65 FE) MG tablet Take 1 tablet (325 mg total) by mouth 2 (two) times daily 180 tablet 0  . gabapentin (NEURONTIN) 600 MG tablet Take 1,200 mg by mouth 2 (two) times daily      . galcanezumab-gnlm (EMGALITY PEN) 120 mg/mL PnIj Inject 120 mg subcutaneously every 28 (twenty-eight) days (Patient taking differently: Inject 120 mg subcutaneously every 28 (twenty-eight) days Last dose 09/25/18 ) 1 Syringe 10  . ketotifen (ZADITOR) 0.025 % (0.035 %) ophthalmic solution Place 1 drop into both eyes 3 (three) times daily as needed.    . lactulose (ENULOSE) 10 gram/15 mL oral solution Take 15 mLs by mouth as needed for Constipation.      . levETIRAcetam (KEPPRA) 500 MG tablet Take 1 tablet (500 mg total) by mouth 2 (two) times daily 180 tablet 3  . linaCLOtide (LINZESS) 145 mcg capsule Take 1 capsule (145 mcg total) by mouth once daily 90 capsule 3  . metFORMIN (GLUCOPHAGE) 500 MG tablet Take 2 tablets (1,000 mg total) by mouth 2 (two) times daily with meals 180 tablet 3  . morphine (MS CONTIN) 60 MG ER tablet Take 1 tablet (60 mg total) by mouth every 12 (twelve) hours 60 tablet 0  . morphine (MS CONTIN) 60 MG ER tablet Take 1 tablet (60 mg total) by mouth every 12 (twelve) hours 60 tablet 0  . morphine (MSIR) 30 MG immediate release tablet One tab QID PRN pain (max 4 per day) (Patient taking differently: Take 30  mg by mouth 4 (four) times daily (max 4 per day) ) 120 tablet 0  . mupirocin (BACTROBAN) 2 % ointment Apply topically once daily (Patient taking differently: Apply topically once daily as needed  ) 22 g 5  . NARCAN 4 mg/actuation 1 spray (4 mg total) by Nasal route as directed 2 spray 1  . nebulizer and compressor Devi Reported on 02/06/2016     . nystatin (MYCOSTATIN) 100,000 unit/gram powder Apply topically 2 (two) times daily As needed to rash in groin area 60 g 2  . ONDANSETRON HCL ORAL Take 4 mg by mouth as needed. Reported on 02/06/2016     . ONETOUCH VERIO test strip Use once daily. Use as instrructed 100 each 2  . pen needle, diabetic (BD ULTRA-FINE MINI PEN NEEDLE) 31 gauge x 3/16" needle For daily Victoza injection subcutaneously 90 each 4  . QUEtiapine (SEROQUEL) 100 MG tablet Take 1 tablet (100 mg total) by mouth 2 (two) times daily 180 tablet 3  . RABEprazole (ACIPHEX) 20 mg EC tablet Take 1 tablet (20 mg total) by mouth once daily (Patient taking differently: Take 20 mg by mouth 2 (two) times daily  ) 30 tablet 3  . sildenafil (VIAGRA) 100 MG tablet Take by mouth once daily  as needed    9  . sucralfate (CARAFATE) 1 gram tablet Take 1 tablet (1 g total) by mouth 2 (two) times daily before meals For gastric pain 60 tablet 3  . SUMAtriptan (IMITREX) 100 MG tablet Take 1/2 to 1 tab at onset of headache. May take a second dose after 2 hours if needed. No more than 2 tabs per 24 hours. 10 tablet 11  . SUMAtriptan succinate 6 mg/0.5 mL Crtg Inject 6 mg subcutaneously once as needed 24 each 3  . traZODone (DESYREL) 100 MG tablet TAKE 1/2 TO 1 TABLET BY MOUTH AT BEDTIME AS NEEDED FOR SLEEP 90 tablet 3  . triamcinolone (NASACORT AQ) 55 mcg nasal spray once daily as needed    12  . verapamil (VERELAN) 240 MG SR capsule TAKE 2 CAPSULES BY MOUTH NIGHTLY 60 capsule 11  . zaleplon (SONATA) 10 MG capsule Take 10 mg by mouth nightly.    Marland Kitchen ZOLMitriptan (ZOMIG) 5 MG tablet Take 1 tablet (5 mg  total) by mouth once as needed for Migraine If headache returns, may take a second dose after 2 hours. 8 tablet 5  . zolpidem (AMBIEN CR) 12.5 MG CR tablet Take 12.5 mg by mouth nightly     No current facility-administered medications for this visit.       ALLERGIES: Etodolac; Lamotrigine; Methocarbamol; Paroxetine hcl; Prednisone; Topiramate; Trintellix [vortioxetine]; Bactrim [sulfamethoxazole-trimethoprim]; Belsomra [suvorexant]; Lactose; Latex; Pregabalin; Ciprofloxacin; Ipratropium analogues; and Naproxen sodium  PAST MEDICAL HISTORY:     Past Medical History:  Diagnosis Date  . Allergic state   . Anemia   . Anxiety   . Arthritis 2010  . Asthma without status asthmaticus 03/1989  . Bipolar disorder (CMS-HCC) 01/2002  . Chronic pain   . DDD (degenerative disc disease), lumbar   . Depression   . Fibromyalgia 2013  . GERD (gastroesophageal reflux disease) 02/2008  . CBULAGTX(646.8) 2007  . Hearing loss   . Low back pain   . Migraine headache   . Neck pain   . Obesity 1990  . Polypharmacy 05/14/2016   Multiple medications for pain, sleep, mood disorder, and headaches. Followed by multiple specialists  . 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 Sleep apnea    No longer has after weight loss  . Stress fracture of metatarsal bone of right foot with routine healing   . 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 Tremor   . Type 2 diabetes mellitus without complication (CMS-HCC) 0/32/1224  . Ulcer 02/2010  . Ulnar neuropathy     PAST SURGICAL HISTORY:      Past Surgical History:  Procedure Laterality Date  . ARTHROSCOPY KNEE W/DEBRIDEMENT/SHAVING ARTICULAR CARTILAGE Right 04/21/2017   Procedure: ARTHROSCOPY, KNEE, SURGICAL; DEBRIDEMENT/SHAVING OF ARTICULAR CARTILAGE (CHONDROPLASTY);  Surgeon: Noralee Space, MD;  Location: [facility] OR;  Service: Orthopedics;  Laterality: Right;  . ASPIRATION/INJECTION MAJOR  JOINT/BURSA KNEE Bilateral 08/17/2018   Procedure: BILATERAL SYNVISC KNEE INJECTIONS;  Surgeon: Marinda Elk, MD;  Location: [facility] OR;  Service: Anes/ Pain Mgmt;  Laterality: Bilateral;  . [place] SURGERY  2010  . COLONOSCOPY N/A 02/09/2016   Procedure: EGD/Colon;  Surgeon: Alena Bills, MD;  Location: [facility] END[facility];  Service: General Surgery;  Laterality: N/A;  . COLONOSCOPY N/A 06/21/2016   Procedure: COLONOSCOPY, FLEXIBLE; DIAGNOSTIC;  Surgeon: Concha Se, MD;  Location: [facility] OR;  Service: Gastroenterology;  Laterality: N/A;  . EGD N/A 02/09/2016   Procedure: EGD / COLONSCOPY / MAC;  Surgeon:  Alena Bills, MD;  Location: [facility] ENDO/BRON[facility];  Service: General Surgery;  Laterality: N/A;  . EGD  09/21/2018   Gastritis/Esophagitis  . HERNIA REPAIR  02/22/2009  . LAPAROSCOPIC GASTRIC BYPASS    . NEUROPLASTY &/OR TRANSPOSITION MEDIAN NERVE AT CARPAL TUNNEL Right 11/04/2017   Procedure: NEUROPLASTY AND/OR TRANSPOSITION; MEDIAN NERVE AT CARPAL TUNNEL;  Surgeon: Rennis Golden, MD;  Location: [facility] OR;  Service: Orthopedics;  Laterality: Right;  . right radial artery  2002   hand/arm went through a window  . VASCULAR SURGERY  2012     FAMILY HISTORY:      Family History  Problem Relation Age of Onset  . Depression Father        DECEASED  . Lung cancer Father        SMALL CELL SARCOMA  . Sleep apnea Father        DECEASED June 08, 2008  . Low back pain Father        DECEASED  . Cancer Father        DECEASED  . Other Father        [place]  . Diabetes type Rivera Maternal Grandmother   . Osteoarthritis Maternal Grandmother   . Rheum arthritis Maternal Grandmother   . Chronic pain Maternal Grandmother   . Diabetes Maternal Grandmother   . Glaucoma Maternal Grandmother   . Osteoporosis (Thinning of bones) Maternal Grandmother   . Stroke Maternal Grandmother   . Cancer Maternal  Grandfather        DECEASED  . Diabetes Paternal Grandmother        DECEASED  . Osteoarthritis Paternal Grandmother        DECEASED  . Diabetes type Rivera Paternal Grandmother        DECEASED  . Rheum arthritis Paternal Grandmother        DECEASED  . Cancer Paternal Grandfather        MVA  . Alcohol abuse Maternal Uncle   . Substance Abuse Maternal Uncle   . Glaucoma Maternal Aunt        GREAT AUNT  . Alcohol abuse Maternal Uncle      SOCIAL HISTORY: Social History          Socioeconomic History  . Marital status: Married    Spouse name: Not on file  . Number of children: Not on file  . Years of education: Not on file  . Highest education level: Not on file  Occupational History  . Not on file  Social Needs  . Financial resource strain: Not on file  . Food insecurity:    Worry: Not on file    Inability: Not on file  . Transportation needs:    Medical: Not on file    Non-medical: Not on file  Tobacco Use  . Smoking status: Current Every Day Smoker    Packs/day: 1.00    Years: 28.00    Pack years: 28.00    Types: Cigarettes  . Smokeless tobacco: Never Used  . Tobacco comment: TRYING  TO QUIT  Substance and Sexual Activity  . Alcohol use: Yes    Types: 1 Shots of liquor per week    Frequency: 2-4 times a month    Comment: Maybe once every 2-3 months  . Drug use: No  . Sexual activity: Yes    Partners: Male    Birth control/protection: None    Comment: Only with Husband  Other Topics Concern  . Would  you please tell us about the people who live in your home, your pets, or anything else important to your social life? No  Social History Academic librarian   Rare alcohol intake   1 pack per day for 25 years   No illicit drug use      PHYSICAL EXAM:    Vitals:   09/28/18 1317  BP: 121/72  Pulse: 95  Temp: 36.4 C (97.5 F)   Body mass index is 34.5 kg/m. Weight: (!) 125.2 kg  (276 lb 0.3 oz)   GENERAL: Alert, active, oriented x3  HEENT: Pupils equal reactive to light. Extraocular movements are intact. Sclera clear. Palpebral conjunctiva normal red color.  NECK: Supple with no palpable mass and no adenopathy.  LUNGS: Sound clear with no rales rhonchi or wheezes.  HEART: Regular rhythm S1 and S2 without murmur.  ABDOMEN: Soft and depressible, nontender with no palpable mass, no hepatomegaly.   EXTREMITIES: Well-developed well-nourished symmetrical with no dependent edema.  NEUROLOGICAL: Awake alert oriented, facial expression symmetrical, moving all extremities.  REVIEW OF DATA: I have reviewed the following data today:       Office Visit on 09/28/2018  Component Date Value   . Vent Rate (bpm) 09/28/2018 83   . PR Interval (msec) 09/28/2018 144   . QRS Interval (msec) 09/28/2018 92   . QT Interval (msec) 09/28/2018 356   . QTc (msec) 09/28/2018 418   Office Visit on 08/24/2018  Component Date Value   . Case Report 08/24/2018                     Value:Non-Gynecologic Cytopathology                     Case: [id number]                                Authorizing Provider:  Gaylene Brooks, NP        Collected:           08/24/2018 1051             Ordering Location:     [facility]     Received:            08/25/2018 1150             Pathologist:           Aldean Ast, MD                                                     Specimen:    Anal Pap Test, Anus                                                                     . Specimen Source A 08/24/2018                     Value:This result contains rich text formatting which cannot be displayed here.  . Diagnostic Interpretatio* 08/24/2018 EPITHELIAL  ABNORMALITY   .   08/24/2018                     Value:This result contains rich text formatting which cannot be displayed here.  . Comment 08/24/2018                     Value:This result contains rich text formatting which  cannot be displayed here.  . Clinical Information 08/24/2018                     Value:This result contains rich text formatting which cannot be displayed here.  . Material Submitted 08/24/2018                     Value:This result contains rich text formatting which cannot be displayed here.  . Statement of Adequacy A 08/24/2018 Satisfactory for evaluation.   Hyman Bower 08/24/2018                     Value:This result contains rich text formatting which cannot be displayed here.  Admission on 08/17/2018, Discharged on 08/17/2018  Component Date Value   . POC Glucose 08/17/2018 91   . POC Glucose 08/17/2018 84      ASSESSMENT: Spencer Rivera is a 42 y.o. male presenting for consultation for cholelithiasis.    Patient was oriented about the diagnosis of cholelithiasis. Also oriented about what is the gallbladder, its anatomy and function and the implications of having stones. The patient was oriented about the treatment alternatives (observation vs cholecystectomy). Patient was oriented that a low percentage of patient will continue to have similar pain symptoms even after the gallbladder is removed. Surgical technique (open vs laparoscopic) was discussed. It was also discussed the goals of the surgery (decrease the pain episodes and avoid the risk of cholecystitis) and the risk of surgery including: bleeding, infection, common bile duct injury, stone retention, injury to other organs such as bowel, liver, stomach, other complications such as hernia, bowel obstruction among others. Also discussed with patient about anesthesia and its complications such as: reaction to medications, pneumonia, heart complications, death, among others.  Patient oriented that his case can be more challenging due to the previus surgery of gastric bypass due to different anatomy and adhesions. Also patient with increased risk of constipation due to obesity and smoking.   PLAN: 1. Laparoscopic  cholecystectomy (66599) 2. CBC, CMP 3. Avoid aspirin 5 days before surgery 4. Contact us if has any question or concern.   Patient verbalized understanding, all questions were answered, and were agreeable with the plan outlined above.   Herbert Pun, MD  Electronically signed by Herbert Pun, MD

## 2018-09-28 NOTE — H&P (View-Only) (Signed)
PATIENT PROFILE: Spencer Rivera is a 42 y.o. male who presents to the Clinic for consultation at the request of Dr. Ellin Mayhew for evaluation of cholelithiasis.  PCP:  Celesta Gentile, MD  HISTORY OF PRESENT ILLNESS: Spencer Rivera reports having epigastric pain since 6 months ago. The patient refers pain starts on the epigastrica area and radiates to both right and left upper quadrants and to the periumbilical area. Pain is aggravated with eating. Pain starts all the time after eating with every meal. Pain goes away by itself. Patient take chronically morphine that does not improves or prevent this pain. Pain is associated with nausea but no vomiting. Patient denies fever or chills.   Patient previously evaluated by GI service that has treated patient for reflux and constipation. Patient had recent upper GI endoscopy and was found with esophagitis and gastritis that are not new finding. This has been controlled with Aciphex.    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   Overview    Anoscopy 07/2017 Dr Gwinda Maine Needs annual follow up in Anorectal surgery clinic [facility]      Type 2 diabetes mellitus without complication (CMS-HCC) 1/56/1537   Drainage from wound 04/28/2017   Degenerative tear of lateral meniscus of right knee 03/28/2017   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  Etiology: likely malabsorption secondary to Gastric bypass B.  Documented since 11/27/11 in [facility] records       1) Last normal H/H documented:12/26/15       2) Hb decrease from 14.1 g/dL in 12/26/15 to 13.2 g/dL in 01/07/16           -MCV unchanged           -no reticulocyte count documented        3) No change in WBC or Platelet count       4) No history of epistaxis, GI bleeding, vaginal bleeding or           Hematuria       5) No history of blood donation C.  Symptomatic history:         1) describes chronic fatigue, dyspnea on exertion, SOB       2) no PICA D.  Diagnostic studies:        1) Procedural studies:             A) Last colonoscopy/findings: Scheduled for 06/2016            B) Last EGD/findings: 02/09/16, unremarkable.       2) Laboratory evaluation  A) last iron studies, 01/07/16: Ferritin=12, TIBC=364, and serum iron=78 E.  [facility] hematology evaluation 05/17/16      1) CBC: WBC= 8.1       H/H= 12.5/38.2         Plt=161      2) Normal studies: CMP, iron studies, TIBC, reticulocytes      3) Abnormal studies: CBC      4) peripheral blood film: Unremarkable.      5) Recommendation: Continue oral iron supplements daily. F.  Treatment history:        1) Transfusions: none.       2)  Iron supplements: ferrous sulfate daily. 02/03/2017: ferrous sulfate BID       3)  Parenteral iron dextran treatments: none.      Polypharmacy 05/14/2016   Overview    Multiple medications for pain, sleep, mood disorder, and headaches. Followed by multiple specialists      Chronic midline low back pain with bilateral sciatica 02/17/2016   Bilateral arm pain 05/23/2015   Ulnar neuropathy of both upper extremities 01/14/2015    Anal dysplasia 10/28/2014   Encounter for monitoring opioid maintenance therapy 10/08/2014   Hearing loss, sensorineural 09/11/2014   DDD (degenerative disc disease), lumbar 03/13/2014   Spinal stenosis 03/13/2014   Chronic migraine 03/13/2014   Chronic, continuous use of opioids 03/13/2014   Radiculopathy 03/13/2014   Back pain 03/13/2014   Bipolar affective disorder (CMS-HCC) 03/13/2014   Splenomegaly 03/07/2014   Depression 11/14/2013   Insomnia 06/19/2013   Overview    Overview:  Struggling w insomnia. Seeing neurology, put on supplements. Source unknown, possible CFS.   Last Assessment & Plan:  Continue f/u sleep specialist Overview:  Struggling w insomnia. Seeing neurology, put on supplements. Source unknown, possible CFS.   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  Immunotherapy resumed in 2017.      Myoclonus 12/20/2011   Thyrotoxicosis, unspecified 12/17/2011   Overview    Overview:  pt denies changes in BM, tremor, sweating, skin changes, palpitations      Thyrotoxicosis without thyroid storm 12/17/2011   Overview    Overview:  pt denies changes in BM, tremor, sweating, skin changes, palpitations      Recurrent major depressive disorder (CMS-HCC) 11/09/2011   Overview    Last Assessment & Plan:  Continues w psych 45mns q2 weeks. Psych has recommended longer sessions more frequently. Stressors continue w sister      Migraine without status migrainosus, not intractable 09/18/2011   Overview    Last Assessment & Plan:  Re migraines, had nasal catheter with lidocaine today.  Plan Cont f/u w HA specialist      Thrombocytopenia (CMS-HCC) 07/19/2011   Overview     Overview:  easy bruising w mildly decreased plt count 9/12      Obesity (BMI 30-39.9), unspecified 07/17/2011   Overview    Overview:  #weight  - was down to 220lbs and med change caused wt gain. Goal was 200lbs, now at 258lbs. Wants to look healthy. Has to make himself eat, low appetite.   reads labels, eats too much pasta but bakes food at home, chicken, fish, pork, not much beef.  Last Assessment &  Plan:  HAS GAINED wt while working at home. Currently focused on sleep mgt, psych stressors.  Plan: Advised that cah resources (rd, cm) are available to him at any time, offered support Overview:  #weight  - was down to 220lbs and med change caused wt gain. Goal was 200lbs, now at 258lbs. Wants to look healthy. Has to make himself eat, low appetite.   reads labels, eats too much pasta but bakes food at home, chicken, fish, pork, not much beef.  Last Assessment & Plan:  HAS GAINED wt while working at home. Currently focused on sleep mgt, psych stressors.  Plan: Advised that cah resources (rd, cm) are available to him at any time, offered support      Sleep apnea 07/17/2011   Overview    Overview:  severe, improved after surgery, now positional. Has new machine and has been sleeping through the night Overview:  severe, improved after surgery, now positional. Has new machine and has been sleeping through the night      Tobacco use disorder 07/17/2011   Overview    Overview:  long time smoker. stopped for bariatric surgery, then restarted w stressors. started wellbutrin 9/12 Overview:  long time smoker. stopped for bariatric surgery, then restarted w stressors. started wellbutrin 9/12      Dorsalgia 07/17/2011   Overview    Overview:  considering surgically implanted stimulator  Last Assessment & Plan:  Re back, has had injections, radiofrequency.  Plan: Cont f/u w pain specialist      Candidiasis of skin and nail 07/17/2011   Overview     Overview:  under stomach, excess skin s/p bypass. using clotrimazole w some improvement         GENERAL REVIEW OF SYSTEMS:   General ROS: negative for - chills, fatigue, weight gain or weight loss. Positive for fatigue Allergy and Immunology ROS: negative for - hives  Hematological and Lymphatic ROS: negative for - bleeding problems or bruising, negative for palpable nodes Endocrine ROS: negative for - heat or cold intolerance, hair changes Respiratory ROS: negative for - cough, shortness of breath or wheezing Cardiovascular ROS: no chest pain or palpitations GI ROS: positive for nausea, vomiting, abdominal pain, constipation. Negative for diarrhea Musculoskeletal ROS: positive for - joint swelling or muscle pain Neurological ROS: negative for - confusion, syncope Dermatological ROS: positive for pruritus and rash Psychiatric: positive for anxiety, difficulty sleeping and memory loss  MEDICATIONS: CurrentMedications        Current Outpatient Medications  Medication Sig Dispense Refill  . albuterol (PROVENTIL) 2.5 mg /3 mL (0.083 %) nebulizer solution Take 2.5 mg by nebulization every 4 (four) hours as needed for Wheezing.    Marland Kitchen albuterol (VENTOLIN HFA) 90 mcg/actuation inhaler Inhale 2 inhalations into the lungs every 4 (four) hours as needed for Wheezing 1 Inhaler 5  . azelastine (ASTELIN) 137 mcg nasal spray Place 1 spray into both nostrils 2 (two) times daily as needed    12  . ciclopirox (PENLAC) 8 % topical nail solution Apply topically nightly as needed Apply over nail and surrounding skin.     . clotrimazole-betamethasone (LOTRISONE) 1-0.05 % cream Apply topically continuously as needed    6  . cyclobenzaprine (FLEXERIL) 10 MG tablet Take 1 to 2 po tid prn muscle spasm (Patient taking differently: Take 10 mg by mouth 3 (three) times daily Take 1 to 2 po tid prn muscle spasm ) 360 tablet 1  . dexmethylphenidate (FOCALIN XR) 30 mg XR capsule Take 60 mg  by mouth every  morning    0  . diclofenac (VOLTAREN) 50 MG EC tablet Take 1 tablet (50 mg total) by mouth 2 (two) times daily with meals 180 tablet 1  . DOK 100 mg capsule TAKE ONE CAPSULE BY MOUTH TWICE DAILY (Patient taking differently: Take 100 mg by mouth 2 (two) times daily  ) 60 capsule 5  . DULoxetine (CYMBALTA) 60 MG DR capsule Take 1 capsule (60 mg total) by mouth 2 (two) times daily (Patient taking differently: Take 120 mg by mouth nightly  ) 180 capsule 3  . ergocalciferol, vitamin D2, 50,000 unit capsule Take 1 capsule (50,000 Units total) by mouth once a week 12 capsule 0  . ferrous sulfate 325 (65 FE) MG tablet Take 1 tablet (325 mg total) by mouth 2 (two) times daily 180 tablet 0  . gabapentin (NEURONTIN) 600 MG tablet Take 1,200 mg by mouth 2 (two) times daily      . galcanezumab-gnlm (EMGALITY PEN) 120 mg/mL PnIj Inject 120 mg subcutaneously every 28 (twenty-eight) days (Patient taking differently: Inject 120 mg subcutaneously every 28 (twenty-eight) days Last dose 09/25/18 ) 1 Syringe 10  . ketotifen (ZADITOR) 0.025 % (0.035 %) ophthalmic solution Place 1 drop into both eyes 3 (three) times daily as needed.    . lactulose (ENULOSE) 10 gram/15 mL oral solution Take 15 mLs by mouth as needed for Constipation.      . levETIRAcetam (KEPPRA) 500 MG tablet Take 1 tablet (500 mg total) by mouth 2 (two) times daily 180 tablet 3  . linaCLOtide (LINZESS) 145 mcg capsule Take 1 capsule (145 mcg total) by mouth once daily 90 capsule 3  . metFORMIN (GLUCOPHAGE) 500 MG tablet Take 2 tablets (1,000 mg total) by mouth 2 (two) times daily with meals 180 tablet 3  . morphine (MS CONTIN) 60 MG ER tablet Take 1 tablet (60 mg total) by mouth every 12 (twelve) hours 60 tablet 0  . morphine (MS CONTIN) 60 MG ER tablet Take 1 tablet (60 mg total) by mouth every 12 (twelve) hours 60 tablet 0  . morphine (MSIR) 30 MG immediate release tablet One tab QID PRN pain (max 4 per day) (Patient taking differently: Take 30  mg by mouth 4 (four) times daily (max 4 per day) ) 120 tablet 0  . mupirocin (BACTROBAN) 2 % ointment Apply topically once daily (Patient taking differently: Apply topically once daily as needed  ) 22 g 5  . NARCAN 4 mg/actuation 1 spray (4 mg total) by Nasal route as directed 2 spray 1  . nebulizer and compressor Devi Reported on 02/06/2016     . nystatin (MYCOSTATIN) 100,000 unit/gram powder Apply topically 2 (two) times daily As needed to rash in groin area 60 g 2  . ONDANSETRON HCL ORAL Take 4 mg by mouth as needed. Reported on 02/06/2016     . ONETOUCH VERIO test strip Use once daily. Use as instrructed 100 each 2  . pen needle, diabetic (BD ULTRA-FINE MINI PEN NEEDLE) 31 gauge x 3/16" needle For daily Victoza injection subcutaneously 90 each 4  . QUEtiapine (SEROQUEL) 100 MG tablet Take 1 tablet (100 mg total) by mouth 2 (two) times daily 180 tablet 3  . RABEprazole (ACIPHEX) 20 mg EC tablet Take 1 tablet (20 mg total) by mouth once daily (Patient taking differently: Take 20 mg by mouth 2 (two) times daily  ) 30 tablet 3  . sildenafil (VIAGRA) 100 MG tablet Take by mouth once daily  as needed    9  . sucralfate (CARAFATE) 1 gram tablet Take 1 tablet (1 g total) by mouth 2 (two) times daily before meals For gastric pain 60 tablet 3  . SUMAtriptan (IMITREX) 100 MG tablet Take 1/2 to 1 tab at onset of headache. May take a second dose after 2 hours if needed. No more than 2 tabs per 24 hours. 10 tablet 11  . SUMAtriptan succinate 6 mg/0.5 mL Crtg Inject 6 mg subcutaneously once as needed 24 each 3  . traZODone (DESYREL) 100 MG tablet TAKE 1/2 TO 1 TABLET BY MOUTH AT BEDTIME AS NEEDED FOR SLEEP 90 tablet 3  . triamcinolone (NASACORT AQ) 55 mcg nasal spray once daily as needed    12  . verapamil (VERELAN) 240 MG SR capsule TAKE 2 CAPSULES BY MOUTH NIGHTLY 60 capsule 11  . zaleplon (SONATA) 10 MG capsule Take 10 mg by mouth nightly.    Marland Kitchen ZOLMitriptan (ZOMIG) 5 MG tablet Take 1 tablet (5 mg  total) by mouth once as needed for Migraine If headache returns, may take a second dose after 2 hours. 8 tablet 5  . zolpidem (AMBIEN CR) 12.5 MG CR tablet Take 12.5 mg by mouth nightly     No current facility-administered medications for this visit.       ALLERGIES: Etodolac; Lamotrigine; Methocarbamol; Paroxetine hcl; Prednisone; Topiramate; Trintellix [vortioxetine]; Bactrim [sulfamethoxazole-trimethoprim]; Belsomra [suvorexant]; Lactose; Latex; Pregabalin; Ciprofloxacin; Ipratropium analogues; and Naproxen sodium  PAST MEDICAL HISTORY:     Past Medical History:  Diagnosis Date  . Allergic state   . Anemia   . Anxiety   . Arthritis 2010  . Asthma without status asthmaticus 03/1989  . Bipolar disorder (CMS-HCC) 01/2002  . Chronic pain   . DDD (degenerative disc disease), lumbar   . Depression   . Fibromyalgia 2013  . GERD (gastroesophageal reflux disease) 02/2008  . CBULAGTX(646.8) 2007  . Hearing loss   . Low back pain   . Migraine headache   . Neck pain   . Obesity 1990  . Polypharmacy 05/14/2016   Multiple medications for pain, sleep, mood disorder, and headaches. Followed by multiple specialists  . 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 Sleep apnea    No longer has after weight loss  . Stress fracture of metatarsal bone of right foot with routine healing   . 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 Tremor   . Type 2 diabetes mellitus without complication (CMS-HCC) 0/32/1224  . Ulcer 02/2010  . Ulnar neuropathy     PAST SURGICAL HISTORY:      Past Surgical History:  Procedure Laterality Date  . ARTHROSCOPY KNEE W/DEBRIDEMENT/SHAVING ARTICULAR CARTILAGE Right 04/21/2017   Procedure: ARTHROSCOPY, KNEE, SURGICAL; DEBRIDEMENT/SHAVING OF ARTICULAR CARTILAGE (CHONDROPLASTY);  Surgeon: Noralee Space, MD;  Location: [facility] OR;  Service: Orthopedics;  Laterality: Right;  . ASPIRATION/INJECTION MAJOR  JOINT/BURSA KNEE Bilateral 08/17/2018   Procedure: BILATERAL SYNVISC KNEE INJECTIONS;  Surgeon: Marinda Elk, MD;  Location: [facility] OR;  Service: Anes/ Pain Mgmt;  Laterality: Bilateral;  . [place] SURGERY  2010  . COLONOSCOPY N/A 02/09/2016   Procedure: EGD/Colon;  Surgeon: Alena Bills, MD;  Location: [facility];  Service: General Surgery;  Laterality: N/A;  . COLONOSCOPY N/A 06/21/2016   Procedure: COLONOSCOPY, FLEXIBLE; DIAGNOSTIC;  Surgeon: Concha Se, MD;  Location: [facility] OR;  Service: Gastroenterology;  Laterality: N/A;  . EGD N/A 02/09/2016   Procedure: EGD / COLONSCOPY / MAC;  Surgeon:  Alena Bills, MD;  Location: [facility] ENDO/BRON[facility];  Service: General Surgery;  Laterality: N/A;  . EGD  09/21/2018   Gastritis/Esophagitis  . HERNIA REPAIR  02/22/2009  . LAPAROSCOPIC GASTRIC BYPASS    . NEUROPLASTY &/OR TRANSPOSITION MEDIAN NERVE AT CARPAL TUNNEL Right 11/04/2017   Procedure: NEUROPLASTY AND/OR TRANSPOSITION; MEDIAN NERVE AT CARPAL TUNNEL;  Surgeon: Rennis Golden, MD;  Location: [facility] OR;  Service: Orthopedics;  Laterality: Right;  . right radial artery  2002   hand/arm went through a window  . VASCULAR SURGERY  2012     FAMILY HISTORY:      Family History  Problem Relation Age of Onset  . Depression Father        DECEASED  . Lung cancer Father        SMALL CELL SARCOMA  . Sleep apnea Father        DECEASED 06/08/08  . Low back pain Father        DECEASED  . Cancer Father        DECEASED  . Other Father        [place]  . Diabetes type Rivera Maternal Grandmother   . Osteoarthritis Maternal Grandmother   . Rheum arthritis Maternal Grandmother   . Chronic pain Maternal Grandmother   . Diabetes Maternal Grandmother   . Glaucoma Maternal Grandmother   . Osteoporosis (Thinning of bones) Maternal Grandmother   . Stroke Maternal Grandmother   . Cancer Maternal  Grandfather        DECEASED  . Diabetes Paternal Grandmother        DECEASED  . Osteoarthritis Paternal Grandmother        DECEASED  . Diabetes type Rivera Paternal Grandmother        DECEASED  . Rheum arthritis Paternal Grandmother        DECEASED  . Cancer Paternal Grandfather        MVA  . Alcohol abuse Maternal Uncle   . Substance Abuse Maternal Uncle   . Glaucoma Maternal Aunt        GREAT AUNT  . Alcohol abuse Maternal Uncle      SOCIAL HISTORY: Social History          Socioeconomic History  . Marital status: Married    Spouse name: Not on file  . Number of children: Not on file  . Years of education: Not on file  . Highest education level: Not on file  Occupational History  . Not on file  Social Needs  . Financial resource strain: Not on file  . Food insecurity:    Worry: Not on file    Inability: Not on file  . Transportation needs:    Medical: Not on file    Non-medical: Not on file  Tobacco Use  . Smoking status: Current Every Day Smoker    Packs/day: 1.00    Years: 28.00    Pack years: 28.00    Types: Cigarettes  . Smokeless tobacco: Never Used  . Tobacco comment: TRYING  TO QUIT  Substance and Sexual Activity  . Alcohol use: Yes    Types: 1 Shots of liquor per week    Frequency: 2-4 times a month    Comment: Maybe once every 2-3 months  . Drug use: No  . Sexual activity: Yes    Partners: Male    Birth control/protection: None    Comment: Only with Husband  Other Topics Concern  . Would  you please tell us about the people who live in your home, your pets, or anything else important to your social life? No  Social History Academic librarian   Rare alcohol intake   1 pack per day for 25 years   No illicit drug use      PHYSICAL EXAM:    Vitals:   09/28/18 1317  BP: 121/72  Pulse: 95  Temp: 36.4 C (97.5 F)   Body mass index is 34.5 kg/m. Weight: (!) 125.2 kg  (276 lb 0.3 oz)   GENERAL: Alert, active, oriented x3  HEENT: Pupils equal reactive to light. Extraocular movements are intact. Sclera clear. Palpebral conjunctiva normal red color.  NECK: Supple with no palpable mass and no adenopathy.  LUNGS: Sound clear with no rales rhonchi or wheezes.  HEART: Regular rhythm S1 and S2 without murmur.  ABDOMEN: Soft and depressible, nontender with no palpable mass, no hepatomegaly.   EXTREMITIES: Well-developed well-nourished symmetrical with no dependent edema.  NEUROLOGICAL: Awake alert oriented, facial expression symmetrical, moving all extremities.  REVIEW OF DATA: I have reviewed the following data today:       Office Visit on 09/28/2018  Component Date Value   . Vent Rate (bpm) 09/28/2018 83   . PR Interval (msec) 09/28/2018 144   . QRS Interval (msec) 09/28/2018 92   . QT Interval (msec) 09/28/2018 356   . QTc (msec) 09/28/2018 418   Office Visit on 08/24/2018  Component Date Value   . Case Report 08/24/2018                     Value:Non-Gynecologic Cytopathology                     Case: [id number]                                Authorizing Provider:  Gaylene Brooks, NP        Collected:           08/24/2018 1051             Ordering Location:     [facility]     Received:            08/25/2018 1150             Pathologist:           Aldean Ast, MD                                                     Specimen:    Anal Pap Test, Anus                                                                     . Specimen Source A 08/24/2018                     Value:This result contains rich text formatting which cannot be displayed here.  . Diagnostic Interpretatio* 08/24/2018 EPITHELIAL  ABNORMALITY   .   08/24/2018                     Value:This result contains rich text formatting which cannot be displayed here.  . Comment 08/24/2018                     Value:This result contains rich text formatting which  cannot be displayed here.  . Clinical Information 08/24/2018                     Value:This result contains rich text formatting which cannot be displayed here.  . Material Submitted 08/24/2018                     Value:This result contains rich text formatting which cannot be displayed here.  . Statement of Adequacy A 08/24/2018 Satisfactory for evaluation.   Hyman Bower 08/24/2018                     Value:This result contains rich text formatting which cannot be displayed here.  Admission on 08/17/2018, Discharged on 08/17/2018  Component Date Value   . POC Glucose 08/17/2018 91   . POC Glucose 08/17/2018 84      ASSESSMENT: Spencer Rivera is a 42 y.o. male presenting for consultation for cholelithiasis.    Patient was oriented about the diagnosis of cholelithiasis. Also oriented about what is the gallbladder, its anatomy and function and the implications of having stones. The patient was oriented about the treatment alternatives (observation vs cholecystectomy). Patient was oriented that a low percentage of patient will continue to have similar pain symptoms even after the gallbladder is removed. Surgical technique (open vs laparoscopic) was discussed. It was also discussed the goals of the surgery (decrease the pain episodes and avoid the risk of cholecystitis) and the risk of surgery including: bleeding, infection, common bile duct injury, stone retention, injury to other organs such as bowel, liver, stomach, other complications such as hernia, bowel obstruction among others. Also discussed with patient about anesthesia and its complications such as: reaction to medications, pneumonia, heart complications, death, among others.  Patient oriented that his case can be more challenging due to the previus surgery of gastric bypass due to different anatomy and adhesions. Also patient with increased risk of constipation due to obesity and smoking.   PLAN: 1. Laparoscopic  cholecystectomy (60630) 2. CBC, CMP 3. Avoid aspirin 5 days before surgery 4. Contact us if has any question or concern.   Patient verbalized understanding, all questions were answered, and were agreeable with the plan outlined above.   Herbert Pun, MD  Electronically signed by Herbert Pun, MD

## 2018-10-03 ENCOUNTER — Encounter
Admission: RE | Admit: 2018-10-03 | Discharge: 2018-10-03 | Disposition: A | Discharge status: Home / Self Care | Payer: 59 | Source: Ambulatory Visit | Attending: General Surgery | Admitting: General Surgery

## 2018-10-03 ENCOUNTER — Other Ambulatory Visit: Payer: Self-pay

## 2018-10-03 MED ORDER — CEFAZOLIN SODIUM-DEXTROSE 2-4 GM/100ML-% IV SOLN
2.0000 g | INTRAVENOUS | Status: AC
  Administered 2018-10-04: 2 g via INTRAVENOUS

## 2018-10-03 NOTE — Pre-Procedure Instructions (Signed)
Health System Component Name Value Ref Range  Vent Rate (bpm) 83   PR Interval (msec) 144   QRS Interval (msec) 92   QT Interval (msec) 356   QTc (msec) 418   Other Result Information  This result has an attachment that is not available.  Result Narrative  Normal sinus rhythm Normal ECG When compared with ECG of 23-Apr-2017 12:23, No significant change was found I reviewed and concur with this report. Electronically signed VN:[email or web address], MD, Christia Reading ([id number]) on 09/28/2018 12:02:46 PM  Status Results Details   Encounter Summary

## 2018-10-03 NOTE — Patient Instructions (Signed)
Your procedure is scheduled on:10/04/18 Report to Day Surgery. [facility] To find out your arrival time please call [phone] between 1PM - 3PM on 10/03/18   Remember: Instructions that are not followed completely may result in serious medical risk,  up to and including death, or upon the discretion of your surgeon and anesthesiologist your  surgery may need to be rescheduled.     _X__ 1. Do not eat food after midnight the night before your procedure.                 No gum chewing or hard candies. You may drink clear liquids up to 2 hours                 before you are scheduled to arrive for your surgery- DO not drink clear                 liquids within 2 hours of the start of your surgery.                 Clear Liquids include:  water, apple juice without pulp, clear carbohydrate                 drink such as Clearfast of Gatorade, Black Coffee or Tea (Do not add                 anything to coffee or tea).  __X__2.  On the morning of surgery brush your teeth with toothpaste and water, you                may rinse your mouth with mouthwash if you wish.  Do not swallow any toothpaste of mouthwash.     _X__ 3.  No Alcohol for 24 hours before or after surgery.   _X__ 4.  Do Not Smoke or use e-cigarettes For 24 Hours Prior to Your Surgery.                 Do not use any chewable tobacco products for at least 6 hours prior to                 surgery.  ____  5.  Bring all medications with you on the day of surgery if instructed.   _X___  6.  Notify your doctor if there is any change in your medical condition      (cold, fever, infections).     Do not wear jewelry, make-up, hairpins, clips or nail polish. Do not wear lotions, powders, or perfumes. You may wear deodorant. Do not shave 48 hours prior to surgery. Men may shave face and neck. Do not bring valuables to the hospital.    [facility] is not responsible for any belongings or  valuables.  Contacts, dentures or bridgework may not be worn into surgery. Leave your suitcase in the car. After surgery it may be brought to your room. For patients admitted to the hospital, discharge time is determined by your treatment team.   Patients discharged the day of surgery will not be allowed to drive home.   Please read over the following fact sheets that you were given:   Surgical Site Infection Prevention  __X__ Take these medicines the morning of surgery with A SIP OF WATER:    1. CYCLOBENZAPRINE  2. GABAPENTIN  3. LEVETRICETAM   4.MORHINE 60 AND 30  5.ACIPHEX  6.  ____ Fleet Enema (as directed)   __X__ Use CHG Soap  as directed  __X__ Use inhalers on the day of surgery   DO NEB TREATMENT AND BRING INHALERS  ___X_ Stop metformin 2 days prior to surgery LAST DOSE 10/02/18   ____ Take 1/2 of usual insulin dose the night before surgery. No insulin the morning          of surgery.   ____ Stop Coumadin/Plavix/aspirin on   __X__ Stop Anti-inflammatories on      NO MORE VOLTAREN UNTIL AFTER SURGERY   ____ Stop supplements until after surgery.    ____ Bring C-Pap to the hospital.

## 2018-10-04 ENCOUNTER — Encounter
Admission: AD | Disposition: A | Discharge status: Home / Self Care | Payer: Self-pay | Source: Home / Self Care | Attending: General Surgery

## 2018-10-04 ENCOUNTER — Other Ambulatory Visit: Payer: Self-pay

## 2018-10-04 ENCOUNTER — Ambulatory Visit: Payer: 59 | Admitting: Anesthesiology

## 2018-10-04 ENCOUNTER — Inpatient Hospital Stay
Admission: AD | Admit: 2018-10-04 | Discharge: 2018-10-05 | DRG: 909 | Disposition: A | Discharge status: Home / Self Care | Payer: 59 | Attending: General Surgery | Admitting: General Surgery

## 2018-10-04 DIAGNOSIS — Z9884 Bariatric surgery status: Secondary | ICD-10-CM | POA: Diagnosis not present

## 2018-10-04 DIAGNOSIS — H905 Unspecified sensorineural hearing loss: Secondary | ICD-10-CM | POA: Diagnosis present

## 2018-10-04 DIAGNOSIS — Z888 Allergy status to other drugs, medicaments and biological substances status: Secondary | ICD-10-CM | POA: Diagnosis not present

## 2018-10-04 DIAGNOSIS — Z7984 Long term (current) use of oral hypoglycemic drugs: Secondary | ICD-10-CM | POA: Diagnosis not present

## 2018-10-04 DIAGNOSIS — Z818 Family history of other mental and behavioral disorders: Secondary | ICD-10-CM

## 2018-10-04 DIAGNOSIS — M5116 Intervertebral disc disorders with radiculopathy, lumbar region: Secondary | ICD-10-CM | POA: Diagnosis present

## 2018-10-04 DIAGNOSIS — K8020 Calculus of gallbladder without cholecystitis without obstruction: Secondary | ICD-10-CM | POA: Diagnosis present

## 2018-10-04 DIAGNOSIS — K2970 Gastritis, unspecified, without bleeding: Secondary | ICD-10-CM | POA: Diagnosis present

## 2018-10-04 DIAGNOSIS — Y92234 Operating room of hospital as the place of occurrence of the external cause: Secondary | ICD-10-CM | POA: Diagnosis present

## 2018-10-04 DIAGNOSIS — Z91040 Latex allergy status: Secondary | ICD-10-CM | POA: Diagnosis not present

## 2018-10-04 DIAGNOSIS — Z6834 Body mass index (BMI) 34.0-34.9, adult: Secondary | ICD-10-CM

## 2018-10-04 DIAGNOSIS — Z881 Allergy status to other antibiotic agents status: Secondary | ICD-10-CM

## 2018-10-04 DIAGNOSIS — Z833 Family history of diabetes mellitus: Secondary | ICD-10-CM | POA: Diagnosis not present

## 2018-10-04 DIAGNOSIS — E669 Obesity, unspecified: Secondary | ICD-10-CM | POA: Diagnosis present

## 2018-10-04 DIAGNOSIS — Z79899 Other long term (current) drug therapy: Secondary | ICD-10-CM

## 2018-10-04 DIAGNOSIS — K660 Peritoneal adhesions (postprocedural) (postinfection): Secondary | ICD-10-CM | POA: Diagnosis present

## 2018-10-04 DIAGNOSIS — S36439A Laceration of unspecified part of small intestine, initial encounter: Secondary | ICD-10-CM | POA: Diagnosis present

## 2018-10-04 DIAGNOSIS — K9171 Accidental puncture and laceration of a digestive system organ or structure during a digestive system procedure: Secondary | ICD-10-CM | POA: Diagnosis present

## 2018-10-04 DIAGNOSIS — F17210 Nicotine dependence, cigarettes, uncomplicated: Secondary | ICD-10-CM | POA: Diagnosis present

## 2018-10-04 DIAGNOSIS — Y658 Other specified misadventures during surgical and medical care: Secondary | ICD-10-CM | POA: Diagnosis present

## 2018-10-04 DIAGNOSIS — K210 Gastro-esophageal reflux disease with esophagitis: Secondary | ICD-10-CM | POA: Diagnosis present

## 2018-10-04 DIAGNOSIS — M4800 Spinal stenosis, site unspecified: Secondary | ICD-10-CM | POA: Diagnosis present

## 2018-10-04 DIAGNOSIS — E119 Type 2 diabetes mellitus without complications: Secondary | ICD-10-CM | POA: Diagnosis present

## 2018-10-04 DIAGNOSIS — Z8262 Family history of osteoporosis: Secondary | ICD-10-CM

## 2018-10-04 DIAGNOSIS — Z8261 Family history of arthritis: Secondary | ICD-10-CM | POA: Diagnosis not present

## 2018-10-04 DIAGNOSIS — F319 Bipolar disorder, unspecified: Secondary | ICD-10-CM | POA: Diagnosis present

## 2018-10-04 DIAGNOSIS — Z801 Family history of malignant neoplasm of trachea, bronchus and lung: Secondary | ICD-10-CM

## 2018-10-04 HISTORY — PX: CHOLECYSTECTOMY: SHX55

## 2018-10-04 HISTORY — PX: BOWEL RESECTION: SHX1257

## 2018-10-04 LAB — GLUCOSE, CAPILLARY
Glucose-Capillary: 80 mg/dL (ref 70–99)
Glucose-Capillary: 138 mg/dL — ABNORMAL HIGH (ref 70–99)

## 2018-10-04 SURGERY — LAPAROSCOPIC CHOLECYSTECTOMY
Anesthesia: General | Site: Abdomen

## 2018-10-04 MED ORDER — SODIUM CHLORIDE (PF) 0.9 % IJ SOLN
INTRAMUSCULAR | Status: AC
  Filled 2018-10-04: qty 10

## 2018-10-04 MED ORDER — SODIUM CHLORIDE 0.9 % IV SOLN
INTRAVENOUS | Status: DC
  Administered 2018-10-04 (×2): via INTRAVENOUS

## 2018-10-04 MED ORDER — SUCRALFATE 1 G PO TABS
1.0000 g | ORAL_TABLET | Freq: Two times a day (BID) | ORAL | Status: DC
  Administered 2018-10-04 – 2018-10-05 (×2): 1 g via ORAL
  Filled 2018-10-04 (×2): qty 1

## 2018-10-04 MED ORDER — CEFAZOLIN SODIUM-DEXTROSE 2-4 GM/100ML-% IV SOLN
INTRAVENOUS | Status: AC
  Filled 2018-10-04: qty 100

## 2018-10-04 MED ORDER — MORPHINE SULFATE 15 MG PO TABS
30.0000 mg | ORAL_TABLET | Freq: Four times a day (QID) | ORAL | Status: DC
  Administered 2018-10-04 – 2018-10-05 (×3): 30 mg via ORAL
  Filled 2018-10-04 (×3): qty 2

## 2018-10-04 MED ORDER — GALCANEZUMAB-GNLM 120 MG/ML ~~LOC~~ SOAJ: 120.0000 mg | SUBCUTANEOUS | Status: DC

## 2018-10-04 MED ORDER — PROPOFOL 10 MG/ML IV BOLUS
INTRAVENOUS | Status: AC
  Filled 2018-10-04: qty 20

## 2018-10-04 MED ORDER — FENTANYL CITRATE (PF) 100 MCG/2ML IJ SOLN
INTRAMUSCULAR | Status: AC
  Filled 2018-10-04: qty 2

## 2018-10-04 MED ORDER — SILDENAFIL CITRATE 100 MG PO TABS: 100.0000 mg | ORAL_TABLET | Freq: Every day | ORAL | Status: DC | PRN

## 2018-10-04 MED ORDER — NALOXONE HCL 4 MG/0.1ML NA LIQD
4.0000 mg | NASAL | Status: DC | PRN
  Filled 2018-10-04: qty 8

## 2018-10-04 MED ORDER — MOMETASONE FURO-FORMOTEROL FUM 200-5 MCG/ACT IN AERO
2.0000 | INHALATION_SPRAY | Freq: Two times a day (BID) | RESPIRATORY_TRACT | Status: DC
  Filled 2018-10-04: qty 8.8

## 2018-10-04 MED ORDER — ACETAMINOPHEN 10 MG/ML IV SOLN
INTRAVENOUS | Status: DC | PRN
  Administered 2018-10-04: 1000 mg via INTRAVENOUS

## 2018-10-04 MED ORDER — VERAPAMIL HCL ER 240 MG PO TBCR
480.0000 mg | EXTENDED_RELEASE_TABLET | Freq: Every day | ORAL | Status: DC
  Administered 2018-10-04: 480 mg via ORAL
  Filled 2018-10-04 (×2): qty 2

## 2018-10-04 MED ORDER — FENTANYL CITRATE (PF) 100 MCG/2ML IJ SOLN
INTRAMUSCULAR | Status: DC | PRN
  Administered 2018-10-04 (×2): 50 ug via INTRAVENOUS
  Administered 2018-10-04: 100 ug via INTRAVENOUS
  Administered 2018-10-04 (×2): 50 ug via INTRAVENOUS
  Administered 2018-10-04: 25 ug via INTRAVENOUS
  Administered 2018-10-04: 50 ug via INTRAVENOUS
  Administered 2018-10-04: 25 ug via INTRAVENOUS

## 2018-10-04 MED ORDER — ALBUTEROL SULFATE (2.5 MG/3ML) 0.083% IN NEBU: 2.5000 mg | INHALATION_SOLUTION | RESPIRATORY_TRACT | Status: DC | PRN

## 2018-10-04 MED ORDER — ROCURONIUM BROMIDE 100 MG/10ML IV SOLN
INTRAVENOUS | Status: DC | PRN
  Administered 2018-10-04 (×4): 10 mg via INTRAVENOUS
  Administered 2018-10-04: 20 mg via INTRAVENOUS
  Administered 2018-10-04: 5 mg via INTRAVENOUS
  Administered 2018-10-04: 20 mg via INTRAVENOUS
  Administered 2018-10-04: 35 mg via INTRAVENOUS

## 2018-10-04 MED ORDER — KETOTIFEN FUMARATE 0.025 % OP SOLN
1.0000 [drp] | Freq: Three times a day (TID) | OPHTHALMIC | Status: DC | PRN
  Filled 2018-10-04: qty 5

## 2018-10-04 MED ORDER — ESMOLOL HCL 100 MG/10ML IV SOLN
INTRAVENOUS | Status: DC | PRN
  Administered 2018-10-04 (×2): 10 mg via INTRAVENOUS

## 2018-10-04 MED ORDER — ONDANSETRON HCL 4 MG PO TABS
4.0000 mg | ORAL_TABLET | Freq: Three times a day (TID) | ORAL | Status: DC | PRN
  Filled 2018-10-04: qty 1

## 2018-10-04 MED ORDER — ZOLPIDEM TARTRATE 5 MG PO TABS
5.0000 mg | ORAL_TABLET | Freq: Every day | ORAL | Status: DC
  Administered 2018-10-04: 5 mg via ORAL
  Filled 2018-10-04: qty 1

## 2018-10-04 MED ORDER — MIDAZOLAM HCL 2 MG/2ML IJ SOLN
INTRAMUSCULAR | Status: AC
  Filled 2018-10-04: qty 2

## 2018-10-04 MED ORDER — DOCUSATE SODIUM 100 MG PO CAPS
100.0000 mg | ORAL_CAPSULE | Freq: Two times a day (BID) | ORAL | Status: DC
  Administered 2018-10-04 – 2018-10-05 (×2): 100 mg via ORAL
  Filled 2018-10-04 (×2): qty 1

## 2018-10-04 MED ORDER — LACTATED RINGERS IV SOLN
INTRAVENOUS | Status: DC | PRN
  Administered 2018-10-04: 13:00:00 via INTRAVENOUS

## 2018-10-04 MED ORDER — FERROUS SULFATE 325 (65 FE) MG PO TABS
325.0000 mg | ORAL_TABLET | Freq: Two times a day (BID) | ORAL | Status: DC
  Administered 2018-10-04 – 2018-10-05 (×2): 325 mg via ORAL
  Filled 2018-10-04 (×2): qty 1

## 2018-10-04 MED ORDER — SUCCINYLCHOLINE CHLORIDE 20 MG/ML IJ SOLN
INTRAMUSCULAR | Status: DC | PRN
  Administered 2018-10-04: 120 mg via INTRAVENOUS

## 2018-10-04 MED ORDER — MIDAZOLAM HCL 2 MG/2ML IJ SOLN
INTRAMUSCULAR | Status: DC | PRN
  Administered 2018-10-04: 2 mg via INTRAVENOUS

## 2018-10-04 MED ORDER — HYDROMORPHONE HCL 1 MG/ML IJ SOLN
INTRAMUSCULAR | Status: AC
  Administered 2018-10-04: 1 mg via INTRAVENOUS
  Filled 2018-10-04: qty 1

## 2018-10-04 MED ORDER — ONDANSETRON 4 MG PO TBDP: 4.0000 mg | ORAL_TABLET | Freq: Four times a day (QID) | ORAL | Status: DC | PRN

## 2018-10-04 MED ORDER — MUPIROCIN 2 % EX OINT
1.0000 "application " | TOPICAL_OINTMENT | Freq: Every day | CUTANEOUS | Status: DC | PRN
  Filled 2018-10-04 (×4): qty 22

## 2018-10-04 MED ORDER — ENOXAPARIN SODIUM 40 MG/0.4ML ~~LOC~~ SOLN
40.0000 mg | SUBCUTANEOUS | Status: DC
  Administered 2018-10-05: 40 mg via SUBCUTANEOUS
  Filled 2018-10-04: qty 0.4

## 2018-10-04 MED ORDER — VITAMIN D (ERGOCALCIFEROL) 1.25 MG (50000 UNIT) PO CAPS: 50000.0000 [IU] | ORAL_CAPSULE | ORAL | Status: DC

## 2018-10-04 MED ORDER — DEXMETHYLPHENIDATE HCL ER 5 MG PO CP24
30.0000 mg | ORAL_CAPSULE | Freq: Every day | ORAL | Status: DC
  Filled 2018-10-04: qty 6

## 2018-10-04 MED ORDER — ALBUTEROL SULFATE (2.5 MG/3ML) 0.083% IN NEBU: 2.5000 mg | INHALATION_SOLUTION | Freq: Four times a day (QID) | RESPIRATORY_TRACT | Status: DC | PRN

## 2018-10-04 MED ORDER — FAMOTIDINE 20 MG PO TABS: 20.0000 mg | ORAL_TABLET | Freq: Once | ORAL | Status: DC

## 2018-10-04 MED ORDER — LINACLOTIDE 145 MCG PO CAPS
145.0000 ug | ORAL_CAPSULE | Freq: Every day | ORAL | Status: DC
  Administered 2018-10-05: 145 ug via ORAL
  Filled 2018-10-04: qty 1

## 2018-10-04 MED ORDER — PROPOFOL 10 MG/ML IV BOLUS
INTRAVENOUS | Status: DC | PRN
  Administered 2018-10-04: 200 mg via INTRAVENOUS

## 2018-10-04 MED ORDER — HYDROCODONE-ACETAMINOPHEN 7.5-325 MG PO TABS
1.0000 | ORAL_TABLET | Freq: Four times a day (QID) | ORAL | Status: DC | PRN
  Administered 2018-10-04 – 2018-10-05 (×3): 1 via ORAL
  Filled 2018-10-04 (×4): qty 1

## 2018-10-04 MED ORDER — HYDROMORPHONE HCL 1 MG/ML IJ SOLN
0.2500 mg | INTRAMUSCULAR | Status: DC | PRN
  Administered 2018-10-04 (×2): 0.5 mg via INTRAVENOUS

## 2018-10-04 MED ORDER — ONDANSETRON HCL 4 MG/2ML IJ SOLN: 4.0000 mg | Freq: Four times a day (QID) | INTRAMUSCULAR | Status: DC | PRN

## 2018-10-04 MED ORDER — LEVETIRACETAM 500 MG PO TABS
500.0000 mg | ORAL_TABLET | Freq: Two times a day (BID) | ORAL | Status: DC
  Administered 2018-10-04 – 2018-10-05 (×3): 500 mg via ORAL
  Filled 2018-10-04 (×3): qty 1

## 2018-10-04 MED ORDER — ONDANSETRON HCL 4 MG/2ML IJ SOLN
INTRAMUSCULAR | Status: DC | PRN
  Administered 2018-10-04: 4 mg via INTRAVENOUS

## 2018-10-04 MED ORDER — LIDOCAINE HCL (CARDIAC) PF 100 MG/5ML IV SOSY
PREFILLED_SYRINGE | INTRAVENOUS | Status: DC | PRN
  Administered 2018-10-04: 100 mg via INTRAVENOUS

## 2018-10-04 MED ORDER — CYCLOBENZAPRINE HCL 10 MG PO TABS
20.0000 mg | ORAL_TABLET | Freq: Three times a day (TID) | ORAL | Status: DC
  Administered 2018-10-04 – 2018-10-05 (×3): 20 mg via ORAL
  Filled 2018-10-04 (×3): qty 2

## 2018-10-04 MED ORDER — ROCURONIUM BROMIDE 50 MG/5ML IV SOLN
INTRAVENOUS | Status: AC
  Filled 2018-10-04: qty 1

## 2018-10-04 MED ORDER — MORPHINE SULFATE ER 15 MG PO TBCR
60.0000 mg | EXTENDED_RELEASE_TABLET | Freq: Two times a day (BID) | ORAL | Status: DC
  Administered 2018-10-04 – 2018-10-05 (×2): 60 mg via ORAL
  Filled 2018-10-04 (×2): qty 4

## 2018-10-04 MED ORDER — DULOXETINE HCL 30 MG PO CPEP
120.0000 mg | ORAL_CAPSULE | Freq: Every day | ORAL | Status: DC
  Administered 2018-10-04: 120 mg via ORAL
  Filled 2018-10-04: qty 4

## 2018-10-04 MED ORDER — ONDANSETRON HCL 4 MG/2ML IJ SOLN: 4.0000 mg | Freq: Once | INTRAMUSCULAR | Status: DC | PRN

## 2018-10-04 MED ORDER — SUMATRIPTAN SUCCINATE 6 MG/0.5ML ~~LOC~~ SOLN
6.0000 mg | Freq: Every day | SUBCUTANEOUS | Status: DC | PRN
  Filled 2018-10-04: qty 0.5

## 2018-10-04 MED ORDER — CEFAZOLIN SODIUM 1 G IJ SOLR
INTRAMUSCULAR | Status: AC
  Filled 2018-10-04: qty 20

## 2018-10-04 MED ORDER — SUMATRIPTAN SUCCINATE 50 MG PO TABS
100.0000 mg | ORAL_TABLET | ORAL | Status: DC | PRN
  Filled 2018-10-04: qty 2

## 2018-10-04 MED ORDER — FAMOTIDINE 20 MG PO TABS
ORAL_TABLET | ORAL | Status: AC
  Filled 2018-10-04: qty 1

## 2018-10-04 MED ORDER — METFORMIN HCL 500 MG PO TABS
1000.0000 mg | ORAL_TABLET | Freq: Two times a day (BID) | ORAL | Status: DC
  Administered 2018-10-05: 1000 mg via ORAL
  Filled 2018-10-04 (×2): qty 2

## 2018-10-04 MED ORDER — PANTOPRAZOLE SODIUM 40 MG PO TBEC
40.0000 mg | DELAYED_RELEASE_TABLET | Freq: Every day | ORAL | Status: DC
  Administered 2018-10-04 – 2018-10-05 (×2): 40 mg via ORAL
  Filled 2018-10-04 (×2): qty 1

## 2018-10-04 MED ORDER — DICLOFENAC SODIUM 25 MG PO TBEC
50.0000 mg | DELAYED_RELEASE_TABLET | Freq: Two times a day (BID) | ORAL | Status: DC
  Administered 2018-10-04 – 2018-10-05 (×3): 50 mg via ORAL
  Filled 2018-10-04 (×4): qty 2

## 2018-10-04 MED ORDER — ACETAMINOPHEN 10 MG/ML IV SOLN
INTRAVENOUS | Status: AC
  Filled 2018-10-04: qty 100

## 2018-10-04 MED ORDER — CICLOPIROX 8 % EX SOLN
1.0000 "application " | Freq: Every evening | CUTANEOUS | Status: DC | PRN
  Filled 2018-10-04: qty 1

## 2018-10-04 MED ORDER — BUPIVACAINE-EPINEPHRINE (PF) 0.25% -1:200000 IJ SOLN
INTRAMUSCULAR | Status: DC | PRN
  Administered 2018-10-04: 18 mL

## 2018-10-04 MED ORDER — CEFAZOLIN SODIUM-DEXTROSE 2-3 GM-%(50ML) IV SOLR
INTRAVENOUS | Status: DC | PRN
  Administered 2018-10-04: 2 g via INTRAVENOUS

## 2018-10-04 MED ORDER — BUPIVACAINE-EPINEPHRINE (PF) 0.25% -1:200000 IJ SOLN
INTRAMUSCULAR | Status: AC
  Filled 2018-10-04: qty 30

## 2018-10-04 MED ORDER — GABAPENTIN 600 MG PO TABS
1200.0000 mg | ORAL_TABLET | Freq: Two times a day (BID) | ORAL | Status: DC
Start: 2018-10-04 — End: 2018-10-05
  Administered 2018-10-04 – 2018-10-05 (×3): 1200 mg via ORAL
  Filled 2018-10-04 (×3): qty 2

## 2018-10-04 MED ORDER — ESMOLOL HCL 100 MG/10ML IV SOLN
INTRAVENOUS | Status: AC
  Filled 2018-10-04: qty 10

## 2018-10-04 MED ORDER — QUETIAPINE FUMARATE 25 MG PO TABS
100.0000 mg | ORAL_TABLET | Freq: Every day | ORAL | Status: DC
  Administered 2018-10-04: 100 mg via ORAL
  Filled 2018-10-04: qty 4

## 2018-10-04 MED ORDER — LIDOCAINE HCL (PF) 2 % IJ SOLN
INTRAMUSCULAR | Status: AC
  Filled 2018-10-04: qty 10

## 2018-10-04 MED ORDER — FENTANYL CITRATE (PF) 100 MCG/2ML IJ SOLN
25.0000 ug | INTRAMUSCULAR | Status: AC | PRN
  Administered 2018-10-04 (×6): 25 ug via INTRAVENOUS

## 2018-10-04 MED ORDER — TRAZODONE HCL 100 MG PO TABS
100.0000 mg | ORAL_TABLET | Freq: Every day | ORAL | Status: DC
  Administered 2018-10-04: 100 mg via ORAL
  Filled 2018-10-04: qty 1

## 2018-10-04 MED ORDER — SODIUM CHLORIDE 0.9 % IV SOLN
INTRAVENOUS | Status: DC
  Administered 2018-10-04 (×2): via INTRAVENOUS

## 2018-10-04 MED ORDER — DEXMEDETOMIDINE HCL IN NACL 200 MCG/50ML IV SOLN
INTRAVENOUS | Status: DC | PRN
  Administered 2018-10-04 (×2): 8 ug via INTRAVENOUS

## 2018-10-04 MED ORDER — SUGAMMADEX SODIUM 500 MG/5ML IV SOLN
INTRAVENOUS | Status: DC | PRN
  Administered 2018-10-04: 250 mg via INTRAVENOUS

## 2018-10-04 MED ORDER — HYDROMORPHONE HCL 1 MG/ML IJ SOLN
1.0000 mg | INTRAMUSCULAR | Status: DC | PRN
  Administered 2018-10-04 (×2): 1 mg via INTRAVENOUS
  Filled 2018-10-04 (×2): qty 1

## 2018-10-04 SURGICAL SUPPLY — 46 items
APPLICATOR ARISTA FLEXITIP XL (MISCELLANEOUS) ×3 IMPLANT
APPLIER CLIP 5 13 M/L LIGAMAX5 (MISCELLANEOUS) ×3
BLADE CLIPPER SURG (BLADE) ×3 IMPLANT
BLADE SURG SZ11 CARB STEEL (BLADE) ×3 IMPLANT
CANISTER SUCT 1200ML W/VALVE (MISCELLANEOUS) ×3 IMPLANT
CHLORAPREP W/TINT 26ML (MISCELLANEOUS) ×3 IMPLANT
CLIP APPLIE 5 13 M/L LIGAMAX5 (MISCELLANEOUS) ×2 IMPLANT
COVER LIGHT HANDLE STERIS (MISCELLANEOUS) ×6 IMPLANT
COVER WAND RF STERILE (DRAPES) ×3 IMPLANT
DERMABOND ADVANCED (GAUZE/BANDAGES/DRESSINGS) ×1
DERMABOND ADVANCED .7 DNX12 (GAUZE/BANDAGES/DRESSINGS) ×2 IMPLANT
ELECT REM PT RETURN 9FT ADLT (ELECTROSURGICAL) ×3
ELECTRODE REM PT RTRN 9FT ADLT (ELECTROSURGICAL) ×2 IMPLANT
GLOVE BIO SURGEON STRL SZ 6.5 (GLOVE) ×3 IMPLANT
GOWN STRL REUS W/ TWL LRG LVL3 (GOWN DISPOSABLE) ×8 IMPLANT
GOWN STRL REUS W/TWL LRG LVL3 (GOWN DISPOSABLE) ×4
GRASPER SUT TROCAR 14GX15 (MISCELLANEOUS) IMPLANT
HANDLE YANKAUER SUCT BULB TIP (MISCELLANEOUS) ×3 IMPLANT
HEMOSTAT ARISTA ABSORB 1G (MISCELLANEOUS) ×3 IMPLANT
HEMOSTAT SURGICEL 2X3 (HEMOSTASIS) IMPLANT
IRRIGATION STRYKERFLOW (MISCELLANEOUS) ×2 IMPLANT
IRRIGATOR STRYKERFLOW (MISCELLANEOUS) ×3
IV NS 1000ML (IV SOLUTION) ×1
IV NS 1000ML BAXH (IV SOLUTION) ×2 IMPLANT
KIT TURNOVER KIT A (KITS) ×3 IMPLANT
KITTNER LAPARASCOPIC 5X40 (MISCELLANEOUS) ×3 IMPLANT
LABEL OR SOLS (LABEL) ×3 IMPLANT
NEEDLE HYPO 25X1 1.5 SAFETY (NEEDLE) ×3 IMPLANT
NEEDLE INSUFFLATION 14GA 120MM (NEEDLE) ×3 IMPLANT
NS IRRIG 500ML POUR BTL (IV SOLUTION) ×3 IMPLANT
PACK LAP CHOLECYSTECTOMY (MISCELLANEOUS) ×3 IMPLANT
PENCIL ELECTRO HAND CTR (MISCELLANEOUS) ×3 IMPLANT
POUCH SPECIMEN RETRIEVAL 10MM (ENDOMECHANICALS) ×3 IMPLANT
RELOAD PROXIMATE 75MM BLUE (ENDOMECHANICALS) ×12 IMPLANT
SCISSORS METZENBAUM CVD 33 (INSTRUMENTS) ×3 IMPLANT
SLEEVE ENDOPATH XCEL 5M (ENDOMECHANICALS) ×6 IMPLANT
SPONGE LAP 18X18 RF (DISPOSABLE) ×3 IMPLANT
STAPLER PROXIMATE 75MM BLUE (STAPLE) ×3 IMPLANT
SUT MNCRL AB 4-0 PS2 18 (SUTURE) ×3 IMPLANT
SUT SILK 3-0 (SUTURE) ×6 IMPLANT
SUT VIC AB 0 CT1 36 (SUTURE) IMPLANT
SUT VICRYL 0 AB UR-6 (SUTURE) ×3 IMPLANT
TROCAR XCEL BLUNT TIP 100MML (ENDOMECHANICALS) ×3 IMPLANT
TROCAR XCEL NON-BLD 11X100MML (ENDOMECHANICALS) ×3 IMPLANT
TROCAR XCEL NON-BLD 5MMX100MML (ENDOMECHANICALS) ×3 IMPLANT
TUBING INSUFFLATION (TUBING) ×3 IMPLANT

## 2018-10-04 NOTE — Op Note (Signed)
Preoperative diagnosis:Symptomatic cholelithiasis   Postoperative diagnosis: Symptomatic cholelithiasis.  Procedure: Laparoscopic Cholecystectomy.                       Small bowel resection with anastomosis                      Extensive lysis of adhesions  Anesthesia: GETA   Surgeon: Dr. Windell Moment  Assistant Surgeon: Dr. Lysle Pearl  Wound Classification: Clean Contaminated  Indications: Patient is a 42 y.o. male developed right upper quadrant pain, nausea and vomiting and on workup was found to have cholelithiasis with a normal common duct. Laparoscopic cholecystectomy was elected.  Findings: -Severe adhesions with intra operative enterotomy during enterolysis -Most of the adhesion around the umbilical area twisted around a Prolene suture from the previous surgery -Tension free small bowel anastomosis.  -Critical view of safety achieved -Cystic duct and artery identified, ligated and divided -Adequate hemostasis  Description of procedure: The patient was placed on the operating table in the supine position. General anesthesia was induced. A time-out was completed verifying correct patient, procedure, site, positioning, and implant(s) and/or special equipment prior to beginning this procedure. An orogastric tube was placed. The abdomen was prepped and draped in the usual sterile fashion.  An incision was made above the umbilicus. Dissection was carried down and the fascia grabbed with Kocher clamps. The fascia was open with Metzenbaum. Abundant adhesion were identified and the wound extended cephalad. Sharp dissection of the adhesions was done. Umbilical adhesion with prolene suture were also lysed. A large enterotomy was identified and decision was made to make a small bowel resection. Proximal and distal point of division were identified of a small portion of the small bowel. Small bowel divided with linear cutter. Side to side anastomosis was done with linear cutter and enterotomy closed  with linear cutter. Small mesenteric rent closed with 3-0 Silk interrupted suture. Suture line of enterotomy reinforced with lembert sutures.   The abdominal facias was partially closed with PDS 0 interrupted suture. Hasson trocar inserted and abdominal cavity insufflated. The abdomen was insufflated with carbon dioxide to a pressure of 15 mmHg. The patient tolerated insufflation well. The laparoscope was inserted and the abdomen inspected. A 5-mm trocar inserted on the right costal margin. With scissors, more adhesions were lysed until the abdominal wall above the liver was visualized. A 5-mm trocar in the right epigastrium and another 5-mm trocars along the right costal margin were inserted under direct visualization. The table was placed in the reverse Trendelenburg position with the right side up.  Filmy adhesions between the gallbladder and omentum, duodenum and transverse colon were lysed sharply. The dome of the gallbladder was grasped with an atraumatic grasper passed through the lateral port and retracted over the dome of the liver. The infundibulum was also grasped with an atraumatic grasper through the midclavicular port and retracted toward the right lower quadrant. This maneuver exposed Calot's triangle. The peritoneum overlying the gallbladder infundibulum was then incised and the cystic duct and cystic artery identified and circumferentially dissected. Critical view of safety reviewed before ligating any structure. The cystic duct and cystic artery were then doubly clipped and divided close to the gallbladder.  The gallbladder was then dissected from its peritoneal attachments by electrocautery. Hemostasis was checked and the gallbladder and contained stones were removed using an endoscopic retrieval bag placed through the umbilical port. The gallbladder was passed off the table as a specimen. The gallbladder fossa  was copiously irrigated with saline and hemostasis was obtained. Hemostasis  reinforced with Arixtra. There was no evidence of leakage of the bile from the cystic duct stump. Secondary trocars were removed under direct vision. No bleeding was noted. The laparoscope was withdrawn and facias was finally closed with interrupted PDS 0 sutures. The skin was closed with subcuticular sutures of 4-0 monocryl and topical skin adhesive. The orogastric tube was removed.  The patient tolerated the procedure well and was taken to the postanesthesia care unit in stable condition.   Specimen: Gallbladder and small bowel.   Complications: Enterotomy  EBL: 100 mL

## 2018-10-04 NOTE — Anesthesia Post-op Follow-up Note (Signed)
Anesthesia QCDR form completed.        

## 2018-10-04 NOTE — Interval H&P Note (Signed)
History and Physical Interval Note:  10/04/2018 7:50 AM  Spencer Rivera  has presented today for surgery, with the diagnosis of cholelithiasis.  The various methods of treatment have been discussed with the patient and family. After consideration of risks, benefits and other options for treatment, the patient has consented to  Procedure(s): LAPAROSCOPIC CHOLECYSTECTOMY (N/A) as a surgical intervention .  The patient's history has been reviewed, patient examined, no change in status, stable for surgery.  I have reviewed the patient's chart and labs.  Questions were answered to the patient's satisfaction.     Herbert Pun

## 2018-10-04 NOTE — Transfer of Care (Signed)
Immediate Anesthesia Transfer of Care Note  Patient: Spencer Rivera  Procedure(s) Performed: LAPAROSCOPIC CHOLECYSTECTOMY (N/A ) SMALL BOWEL RESECTION WITH ANASTAMOSIS (Abdomen)  Patient Location: PACU  Anesthesia Type:General  Level of Consciousness: sedated  Airway & Oxygen Therapy: Patient Spontanous Breathing and Patient connected to face mask oxygen  Post-op Assessment: Report given to RN and Post -op Vital signs reviewed and stable  Post vital signs: Reviewed and stable  Last Vitals:  Vitals Value Taken Time  BP 128/65 10/04/2018  1:11 PM  Temp 36.8 C 10/04/2018  1:11 PM  Pulse 113 10/04/2018  1:11 PM  Resp 32 10/04/2018  1:11 PM  SpO2 95 % 10/04/2018  1:11 PM  Vitals shown include unvalidated device data.  Last Pain:  Vitals:   10/04/18 0718  TempSrc: Temporal  PainSc: 5          Complications: No apparent anesthesia complications

## 2018-10-04 NOTE — Anesthesia Preprocedure Evaluation (Signed)
Anesthesia Evaluation  Patient identified by MRN, date of birth, ID band Patient awake    Reviewed: Allergy & Precautions, NPO status , Patient's Chart, lab work & pertinent test results, reviewed documented beta blocker date and time   Airway Mallampati: II  TM Distance: >3 FB     Dental  (+) Chipped, Missing   Pulmonary asthma , Current Smoker,           Cardiovascular negative cardio ROS       Neuro/Psych  Headaches, PSYCHIATRIC DISORDERS Anxiety Depression Bipolar Disorder  Neuromuscular disease    GI/Hepatic GERD  ,  Endo/Other  diabetes, Type 2Hyperthyroidism   Renal/GU      Musculoskeletal  (+) Arthritis , Fibromyalgia -  Abdominal   Peds  Hematology  (+) anemia ,   Anesthesia Other Findings Obese. Gastric bypass.  Reproductive/Obstetrics                             Anesthesia Physical  Anesthesia Plan  ASA: II  Anesthesia Plan: General   Post-op Pain Management:    Induction: Intravenous, Rapid sequence and Cricoid pressure planned  PONV Risk Score and Plan:   Airway Management Planned: Oral ETT  Additional Equipment:   Intra-op Plan:   Post-operative Plan: Extubation in OR  Informed Consent: I have reviewed the patients History and Physical, chart, labs and discussed the procedure including the risks, benefits and alternatives for the proposed anesthesia with the patient or authorized representative who has indicated his/her understanding and acceptance.     Plan Discussed with: CRNA  Anesthesia Plan Comments:         Anesthesia Quick Evaluation

## 2018-10-04 NOTE — Anesthesia Procedure Notes (Signed)
Procedure Name: Intubation Date/Time: 10/04/2018 8:58 AM Performed by: Hedda Slade, CRNA Pre-anesthesia Checklist: Patient identified, Patient being monitored, Timeout performed, Emergency Drugs available and Suction available Patient Re-evaluated:Patient Re-evaluated prior to induction Oxygen Delivery Method: Circle system utilized Preoxygenation: Pre-oxygenation with 100% oxygen Induction Type: IV induction and Rapid sequence Laryngoscope Size: Mac and 4 Grade View: Grade II Tube type: Oral Tube size: 7.5 mm Number of attempts: 1 Airway Equipment and Method: Stylet Placement Confirmation: ETT inserted through vocal cords under direct vision,  positive ETCO2 and breath sounds checked- equal and bilateral Secured at: 23 cm Tube secured with: Tape Dental Injury: Teeth and Oropharynx as per pre-operative assessment

## 2018-10-04 NOTE — Brief Op Note (Addendum)
10/04/2018  8:25 AM  PATIENT:  Spencer Rivera  42 y.o. male  PRE-OPERATIVE DIAGNOSIS:  cholelithiasis  POST-OPERATIVE DIAGNOSIS:  Cholelithiasis                                                         Small bowel laceration  PROCEDURE:  Procedure(s): LAPAROSCOPIC CHOLECYSTECTOMY (N/A)  Small bowel resection with anastomosis Extensive lysis of adhesions  SURGEON:  Surgeon(s) and Role:    Herbert Pun, MD - Primary  ASSISTANTS: Dr. Lysle Pearl   ANESTHESIA:   local and general  EBL:  100 mL   Patient with small bowel laceration noted intra operative needing small bowel resection and anastomosis. Will need to be admitted for post operative treatment of bowel resection and pain management.

## 2018-10-05 LAB — CBC
HEMATOCRIT: 38.5 % — AB (ref 39.0–52.0)
Hemoglobin: 12.9 g/dL — ABNORMAL LOW (ref 13.0–17.0)
MCH: 32.7 pg (ref 26.0–34.0)
MCHC: 33.5 g/dL (ref 30.0–36.0)
MCV: 97.5 fL (ref 80.0–100.0)
NRBC: 0 % (ref 0.0–0.2)
Platelets: 139 10*3/uL — ABNORMAL LOW (ref 150–400)
RBC: 3.95 MIL/uL — AB (ref 4.22–5.81)
RDW: 12.8 % (ref 11.5–15.5)
WBC: 7.6 10*3/uL (ref 4.0–10.5)

## 2018-10-05 LAB — BASIC METABOLIC PANEL
Anion gap: 8 (ref 5–15)
BUN: 8 mg/dL (ref 6–20)
CHLORIDE: 107 mmol/L (ref 98–111)
CO2: 28 mmol/L (ref 22–32)
CREATININE: 0.59 mg/dL — AB (ref 0.61–1.24)
Calcium: 8.3 mg/dL — ABNORMAL LOW (ref 8.9–10.3)
GFR calc Af Amer: >60 mL/min (ref >60)
GFR calc non Af Amer: >60 mL/min (ref >60)
Glucose, Bld: 92 mg/dL (ref 70–99)
POTASSIUM: 3.4 mmol/L — AB (ref 3.5–5.1)
SODIUM: 143 mmol/L (ref 135–145)

## 2018-10-05 LAB — GLUCOSE, CAPILLARY: GLUCOSE-CAPILLARY: 93 mg/dL (ref 70–99)

## 2018-10-05 MED ORDER — HYDROCODONE-ACETAMINOPHEN 7.5-325 MG PO TABS: 1.0000 | ORAL_TABLET | Freq: Four times a day (QID) | ORAL | 0 refills | Status: DC | PRN

## 2018-10-05 MED ORDER — HYDROCODONE-ACETAMINOPHEN 5-325 MG PO TABS: 1.0000 | ORAL_TABLET | ORAL | 0 refills | Status: AC | PRN

## 2018-10-05 MED FILL — Verapamil HCl Tab ER 240 MG: ORAL | Qty: 2 | Status: AC

## 2018-10-05 NOTE — Discharge Instructions (Signed)
°  Diet: Resume home heart healthy full liquid diet. Advance diet in one or two days as tolerated.   Activity: No heavy lifting >20 pounds (children, pets, laundry, garbage) or strenuous activity until follow-up, but light activity and walking are encouraged. Do not drive or drink alcohol if taking narcotic pain medications.  Wound care: May shower with soapy water and pat dry (do not rub incisions), but no baths or submerging incision underwater until follow-up. (no swimming)   Medications: Resume all home medications. For mild to moderate pain: acetaminophen (Tylenol) or ibuprofen (if no kidney disease). Combining Tylenol with alcohol can substantially increase your risk of causing liver disease. Narcotic pain medications, if prescribed, can be used for severe pain, though may cause nausea, constipation, and drowsiness. Do not combine Tylenol and Norco within a 6 hour period as Norco contains Tylenol. If you do not need the narcotic pain medication, you do not need to fill the prescription.  Call office [phone]) at any time if any questions, worsening pain, fevers/chills, bleeding, drainage from incision site, or other concerns.

## 2018-10-05 NOTE — Plan of Care (Signed)
  Problem: Education: Goal: Knowledge of General Education information will improve Description Including pain rating scale, medication(s)/side effects and non-pharmacologic comfort measures Outcome: Progressing   Problem: Health Behavior/Discharge Planning: Goal: Ability to manage health-related needs will improve Outcome: Progressing   Problem: Clinical Measurements: Goal: Ability to maintain clinical measurements within normal limits will improve Outcome: Progressing Goal: Will remain free from infection Outcome: Progressing Goal: Diagnostic test results will improve Outcome: Progressing Goal: Respiratory complications will improve Outcome: Progressing Goal: Cardiovascular complication will be avoided Outcome: Progressing   Problem: Activity: Goal: Risk for activity intolerance will decrease Outcome: Progressing   Problem: Nutrition: Goal: Adequate nutrition will be maintained Outcome: Progressing  Tolerating full liquid diet   Problem: Pain Managment: Goal: General experience of comfort will improve Outcome: Progressing  Patient takes multiple pain medications for surgical and chronic back pain.

## 2018-10-05 NOTE — Final Progress Note (Signed)
Patient main pharmacy is closed today. I resended the medication to [facility] at [facility] that is open today. Initially I send Noroc 5-325. Since patient was using the 7.5-325 at the hospital I resend it to the 7.5-325. I called the pharmacy and they were going to cancel the 5-325.

## 2018-10-05 NOTE — Discharge Summary (Signed)
Patient ID: Spencer Rivera MRN: [id number] DOB/AGE: [birth year] 42 y.o.  Admit date: 10/04/2018 Discharge date: 10/05/2018   Discharge Diagnoses:  Active Problems:   Small bowel laceration   Cholelithiasis status post laparoscopic cholecystectomy  Procedures: Laparoscopic cholecystectomy                        Small bowel resection with anastomosis                         Extensive enterolysis  Hospital Course: Patient came to [facility] for laparoscopic cholecystectomy. Due to previous surgery and abundant adhesions, a small bowel injury was identified. Small bowel resection with anastomosis done for repair. There was significant pain port operative. Patient admitted for post op care. Today the patient has a much better pain controlled. No IV medication since yesterday. Patient tolerated full liquid diet and passing gas. The wounds are dry and clean. Patient is ambulating. Patient understand how to take care of the wound and how to advance the diet.   Physical Exam  Constitutional: He is well-developed, well-nourished, and in no distress.  Cardiovascular: Normal rate and regular rhythm.  Pulmonary/Chest: Effort normal and breath sounds normal.  Abdominal: Soft. Bowel sounds are normal. He exhibits no distension. There is no rebound and no guarding.  Wounds are dry and clean.    Consults: None  Disposition: Discharge disposition: 01-Home or Self Care       Discharge Instructions    Diet - low sodium heart healthy   Complete by:  As directed    Increase activity slowly   Complete by:  As directed      Allergies as of 10/05/2018      Reactions   Belsomra [suvorexant] Anaphylaxis   Hallucinations   Ciprofloxacin    Ipratropium Swelling   Lactose Intolerance (gi)    Lamotrigine    Topiramate    Bactrim [sulfamethoxazole-trimethoprim] Rash   Etodolac Itching, Swelling, Rash   Latex Rash   Methocarbamol Itching, Swelling, Rash   Naproxen Sodium Rash   Paroxetine  Hcl Itching, Swelling, Rash   Prednisone Hives, Itching, Swelling, Rash   Pregabalin Rash   Trintellix [vortioxetine] Anxiety      Medication List    TAKE these medications   albuterol (2.5 MG/3ML) 0.083% nebulizer solution Commonly known as:  PROVENTIL Take 2.5 mg by nebulization every 6 (six) hours as needed for wheezing or shortness of breath.   albuterol 108 (90 Base) MCG/ACT inhaler Commonly known as:  PROVENTIL HFA;VENTOLIN HFA Inhale 2 puffs into the lungs every 4 (four) hours as needed for wheezing or shortness of breath.   ciclopirox 8 % solution Commonly known as:  PENLAC Apply 1 application topically at bedtime as needed (for nail fungus). Apply over nail and surrounding skin. Apply daily over previous coat. After seven (7) days, may remove with alcohol and continue cycle.   cyclobenzaprine 10 MG tablet Commonly known as:  FLEXERIL Take 20 mg by mouth 3 (three) times daily.   diclofenac 50 MG EC tablet Commonly known as:  VOLTAREN Take 50 mg by mouth 2 (two) times daily.   docusate sodium 100 MG capsule Commonly known as:  COLACE Take 100 mg by mouth 2 (two) times daily.   DULoxetine 60 MG capsule Commonly known as:  CYMBALTA Take 120 mg by mouth at bedtime.   EMGALITY 120 MG/ML Soaj Generic drug:  Galcanezumab-gnlm Inject 120 mg into the skin every 28 (  twenty-eight) days.   ferrous sulfate 325 (65 FE) MG tablet Take 325 mg by mouth 2 (two) times daily.   Fluticasone-Salmeterol 500-50 MCG/DOSE Aepb Commonly known as:  ADVAIR Inhale 1 puff into the lungs 2 (two) times daily as needed (for respiratory issues.).   FOCALIN XR 30 MG Cp24 Generic drug:  Dexmethylphenidate HCl Take 30 mg by mouth daily.   gabapentin 600 MG tablet Commonly known as:  NEURONTIN Take 1,200 mg by mouth 2 (two) times daily.   HYDROcodone-acetaminophen 7.5-325 MG tablet Commonly known as:  NORCO Take 1 tablet by mouth every 6 (six) hours as needed for severe pain.    levETIRAcetam 500 MG tablet Commonly known as:  KEPPRA Take 500 mg by mouth 2 (two) times daily.   linaclotide 145 MCG Caps capsule Commonly known as:  LINZESS Take 145 mcg by mouth daily before breakfast.   metFORMIN 500 MG tablet Commonly known as:  GLUCOPHAGE Take 1,000 mg by mouth 2 (two) times daily.   morphine 30 MG tablet Commonly known as:  MSIR Take 30 mg by mouth 4 (four) times daily.   morphine 60 MG 12 hr tablet Commonly known as:  MS CONTIN Take 60 mg by mouth 2 (two) times daily.   mupirocin ointment 2 % Commonly known as:  BACTROBAN Apply 1 application topically daily as needed (for sore in ears.).   NARCAN NA Place 4 mg into the nose as needed (accidental opoid overdose).   ondansetron 4 MG tablet Commonly known as:  ZOFRAN Take 4 mg by mouth every 8 (eight) hours as needed for nausea or vomiting.   QUEtiapine 100 MG tablet Commonly known as:  SEROQUEL Take 100 mg by mouth at bedtime.   RABEprazole 20 MG tablet Commonly known as:  ACIPHEX Take 20 mg by mouth See admin instructions. Take 1 tablet (20 mg) by mouth twice daily x 2 weeks, then decrease to 1 tablet (20 mg) by mouth daily   sildenafil 100 MG tablet Commonly known as:  VIAGRA Take 100 mg by mouth daily as needed for erectile dysfunction.   sucralfate 1 g tablet Commonly known as:  CARAFATE Take 1 g by mouth 2 (two) times daily.   SUMAtriptan 100 MG tablet Commonly known as:  IMITREX Take 100 mg by mouth every 2 (two) hours as needed for migraine. May repeat in 2 hours if headache persists or recurs.   SUMAtriptan 6 MG/0.5ML Soaj Inject 6 mg into the skin daily as needed (for migraine headaches.).   traZODone 100 MG tablet Commonly known as:  DESYREL Take 100 mg by mouth at bedtime.   verapamil 240 MG 24 hr capsule Commonly known as:  VERELAN PM Take 480 mg by mouth at bedtime.   Vitamin D (Ergocalciferol) 1.25 MG (50000 UT) Caps capsule Commonly known as:  DRISDOL Take 50,000  Units by mouth every Tuesday.   ZADITOR 0.025 % ophthalmic solution Generic drug:  ketotifen Place 1 drop into both eyes 3 (three) times daily as needed (dry/irritated eyes.).   zolpidem 12.5 MG CR tablet Commonly known as:  AMBIEN CR Take 12.5 mg by mouth at bedtime.      Follow-up Information    Herbert Pun, MD Follow up in 2 week(s).   Specialty:  General Surgery Contact information: [address] [place] [address] [phone]

## 2018-10-09 ENCOUNTER — Telehealth: Payer: Self-pay

## 2018-10-09 LAB — SURGICAL PATHOLOGY

## 2018-10-09 NOTE — Anesthesia Postprocedure Evaluation (Signed)
Anesthesia Post Note  Patient: SANFORD LINDBLAD  Procedure(s) Performed: LAPAROSCOPIC CHOLECYSTECTOMY (N/A ) SMALL BOWEL RESECTION WITH ANASTAMOSIS (Abdomen)  Patient location during evaluation: PACU Anesthesia Type: General Level of consciousness: awake and alert Pain management: pain level controlled Vital Signs Assessment: post-procedure vital signs reviewed and stable Respiratory status: spontaneous breathing, nonlabored ventilation, respiratory function stable and patient connected to nasal cannula oxygen Cardiovascular status: blood pressure returned to baseline and stable Postop Assessment: no apparent nausea or vomiting Anesthetic complications: no     Last Vitals:  Vitals:   10/04/18 2342 10/05/18 0439  BP: 118/80 122/77  Pulse: 98 95  Resp: 20 20  Temp: 37.1 C 36.8 C  SpO2: 94% 97%    Last Pain:  Vitals:   10/05/18 0439  TempSrc: Oral  PainSc:                  Durenda Hurt

## 2018-10-09 NOTE — Telephone Encounter (Signed)
Flagged on EMMI report for not receiving discharge papers, not knowing who to call regarding changes in condition, not having a follow up scheduled, and not taking meds. Spoke with patient's spouse as patient unavailable.  Spouse states he was the one that completed the EMMI call and apologized that the call did not pick up his answers correctly to some of the questions due to background noise.  They did receive patient's discharge papers and know who to reach out to for any questions or issues.  He was able to fill his medications and is taking them, as well as has a follow up scheduled.  No questions or concerns at this time.  I thanked him for his time and informed him that they would receive one more automated call checking in over the next few days.

## 2019-08-14 ENCOUNTER — Other Ambulatory Visit: Payer: Self-pay | Admitting: General Surgery

## 2019-08-14 DIAGNOSIS — R109 Unspecified abdominal pain: Secondary | ICD-10-CM

## 2019-08-23 ENCOUNTER — Ambulatory Visit: Payer: 59

## 2019-08-30 ENCOUNTER — Other Ambulatory Visit: Payer: Self-pay

## 2019-08-30 ENCOUNTER — Ambulatory Visit
Admission: RE | Admit: 2019-08-30 | Discharge: 2019-08-30 | Disposition: A | Discharge status: Home / Self Care | Payer: 59 | Source: Ambulatory Visit | Attending: General Surgery | Admitting: General Surgery

## 2019-08-30 DIAGNOSIS — R109 Unspecified abdominal pain: Secondary | ICD-10-CM

## 2019-08-30 MED ORDER — IOHEXOL 300 MG/ML  SOLN
100.0000 mL | Freq: Once | INTRAMUSCULAR | Status: AC | PRN
  Administered 2019-08-30: 100 mL via INTRAVENOUS

## 2020-04-06 IMAGING — CT CT ABD-PELV W/ CM
2 of 5 series · 15 of 46 positions shown, 17 images · IV contrast (omnipaque)
Comparison: None.

CLINICAL DATA: Periumbilical pain radiating to right abdomen

EXAM:
CT ABDOMEN AND PELVIS WITH CONTRAST
TECHNIQUE: Multidetector CT imaging of the abdomen and pelvis was performed
using the standard protocol following bolus administration of
intravenous contrast. Oral contrast was also administered.
CONTRAST:  100mL OMNIPAQUE IOHEXOL 300 MG/ML  SOLN

[Series 2: abd pelvis · axial · 0.80mm/px · z∈[-1565,-1115]mm · 12 of 102 slices shown, 14 images]
[im 6/102  soft-tissue]
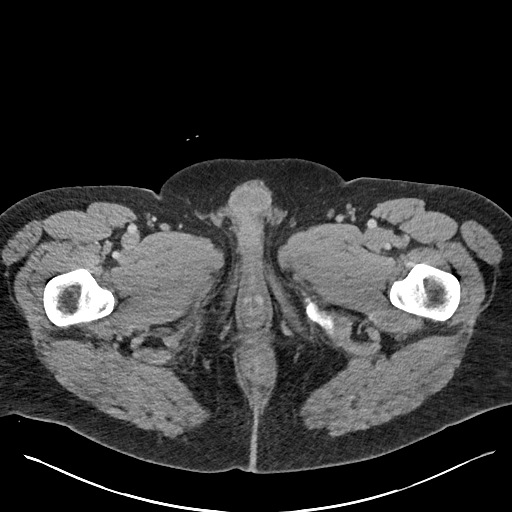
[im 6/102  bone]
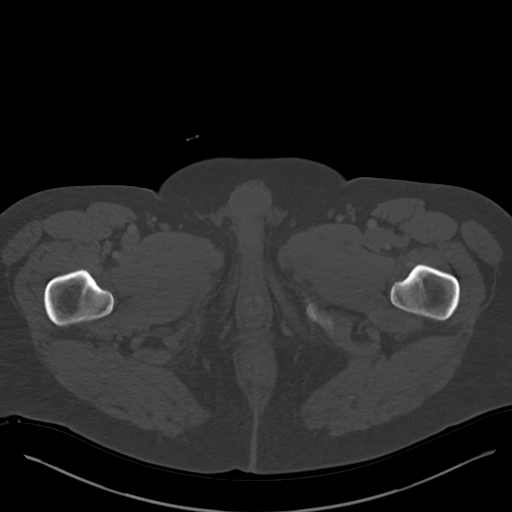
[im 18/102  soft-tissue]
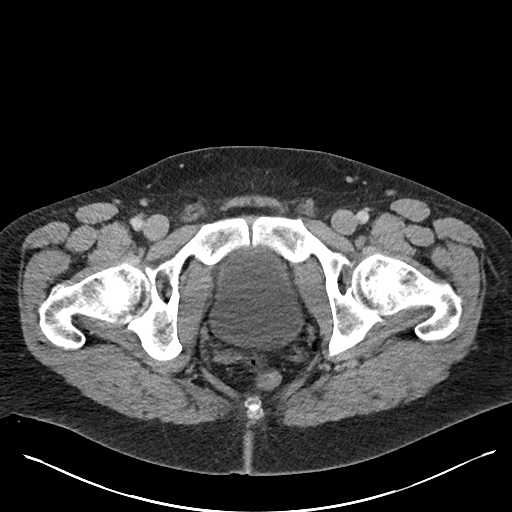
[im 24/102  soft-tissue]
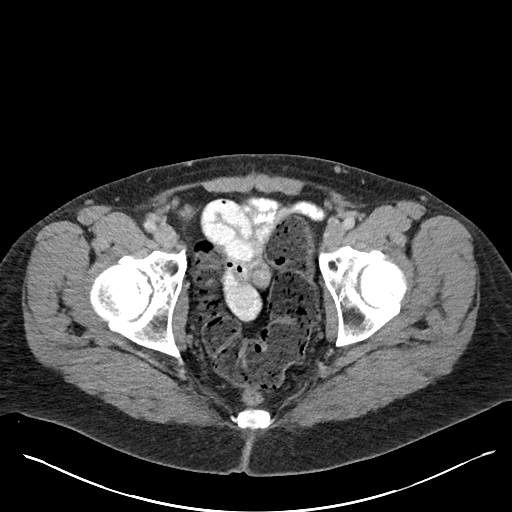
[im 30/102  soft-tissue]
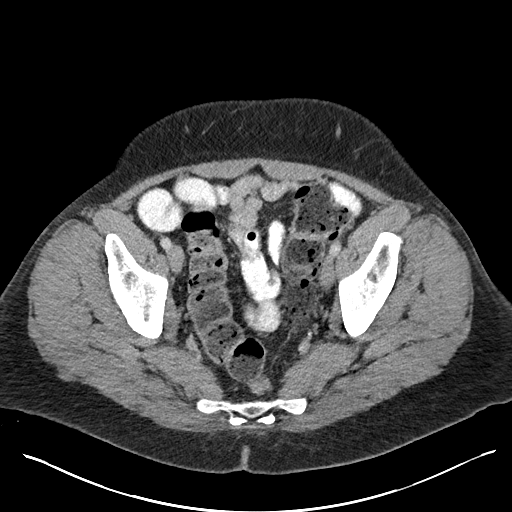
[im 42/102  soft-tissue]
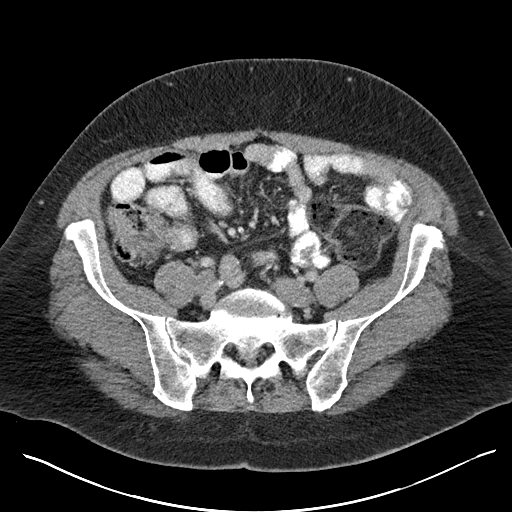
[im 48/102  soft-tissue]
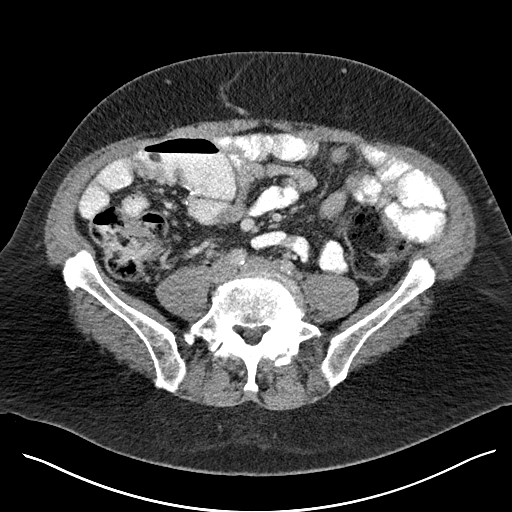
[im 54/102  soft-tissue]
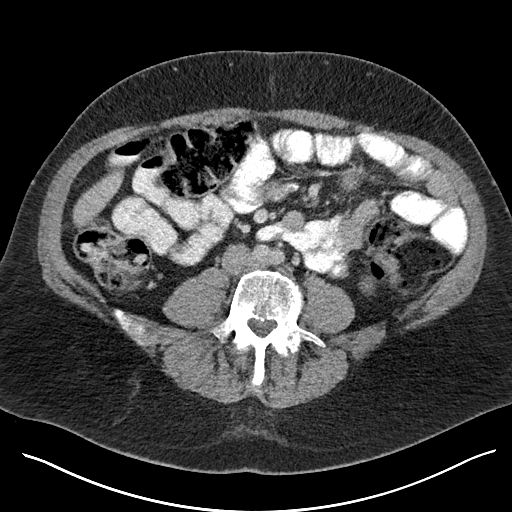
[im 66/102  soft-tissue]
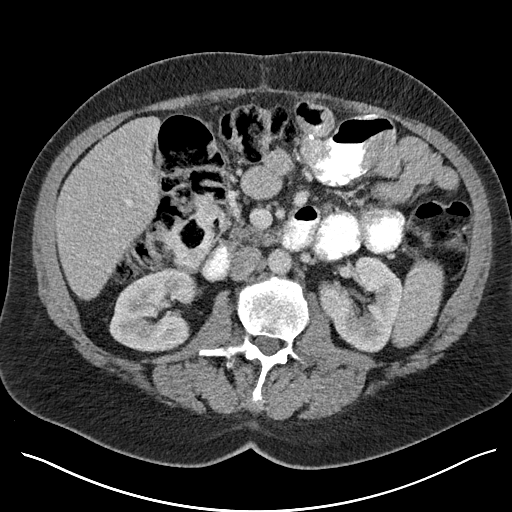
[im 72/102  soft-tissue]
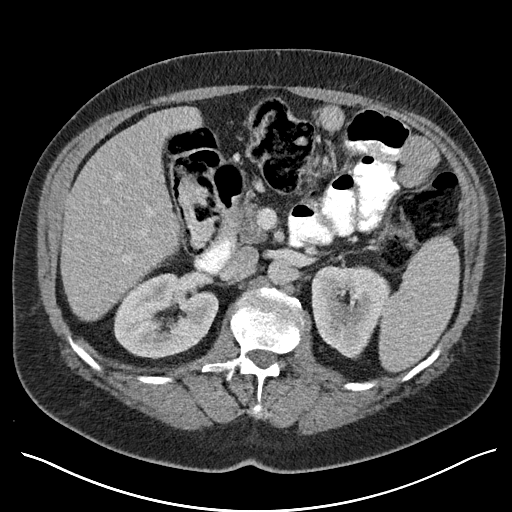
[im 72/102  bone]
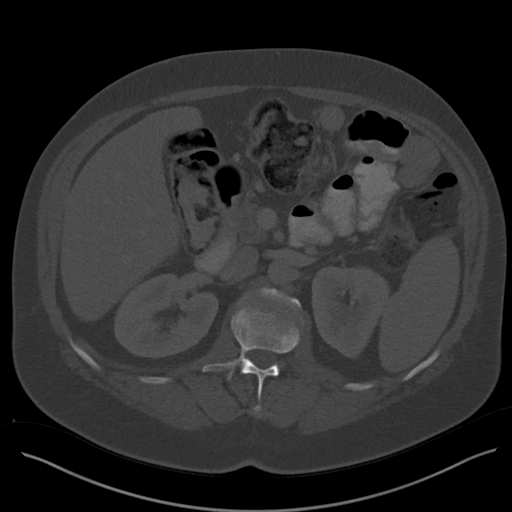
[im 78/102  soft-tissue]
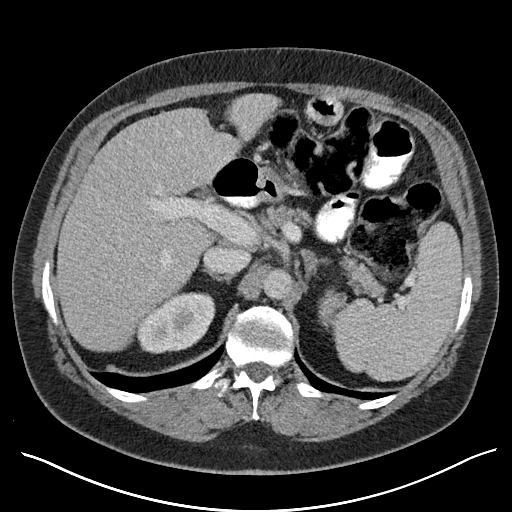
[im 90/102  soft-tissue]
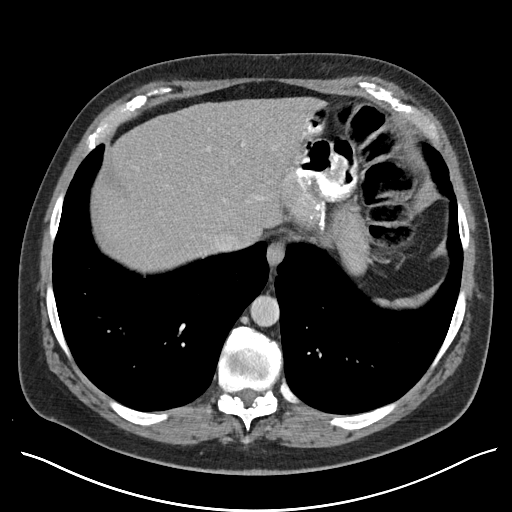
[im 96/102  soft-tissue]
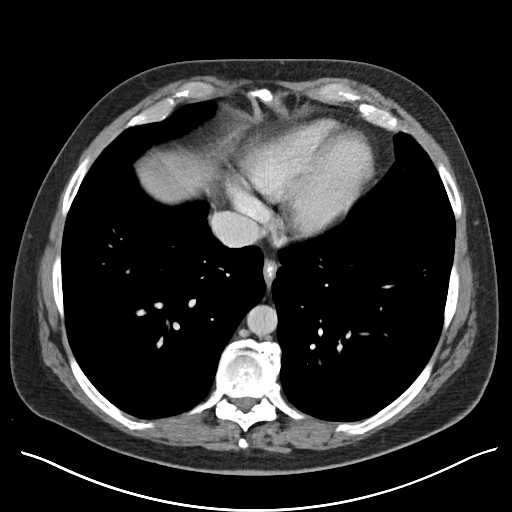

[Series 4: coronals abd pelvis · coronal · 0.80mm/px · 3 of 175 slices shown]
[im 59/175  soft-tissue]
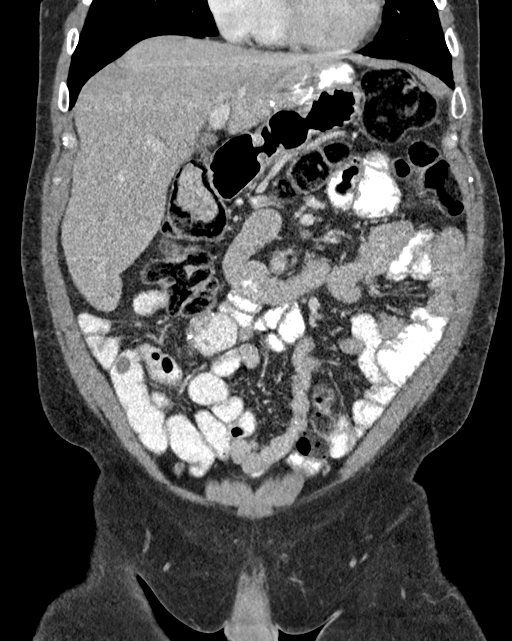
[im 78/175  soft-tissue]
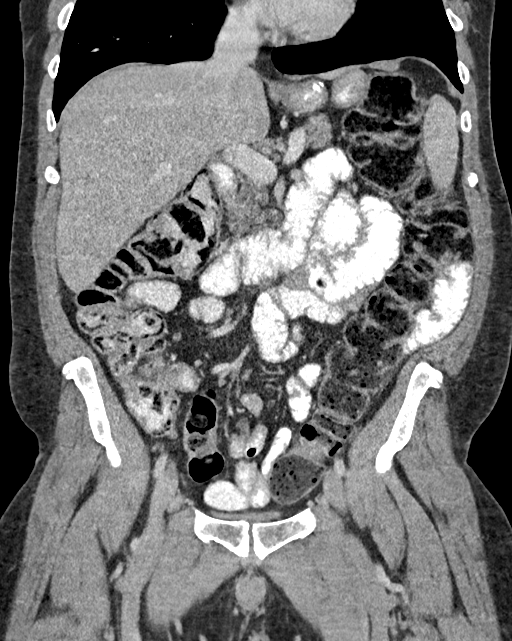
[im 97/175  soft-tissue]
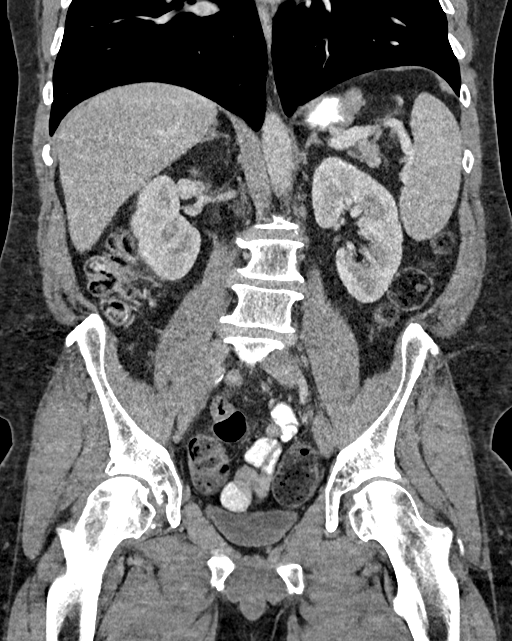

[15 of 46 positions shown; findings below may reference images not displayed]

FINDINGS: Lower chest: Lung bases are clear.

Hepatobiliary: Liver measures 21.9 cm in length. No focal liver
lesions are appreciable. The gallbladder is absent. There is no
biliary duct dilatation.

Pancreas: There is no pancreatic mass or inflammatory focus.

Spleen: No splenic lesions are evident.

Adrenals/Urinary Tract: Adrenals bilaterally appear normal. Kidneys
bilaterally show no evident mass or hydronephrosis on either side.
There is a 2 mm calculus in the lower pole left kidney. There is no
evident ureteral calculus on either side. Urinary bladder is midline
with wall thickness within normal limits.

Stomach/Bowel: There is diffuse stool throughout the colon. Patient
is status post gastric bypass procedure. There is no appreciable
wall thickening or fluid in the perioperative areas. There is a area
of soft tissue opacity with surrounding oral contrast in a loop of
jejunum in the anterior left upper abdomen which measures 2.8 x
cm. This finding is best appreciated on axial slice 36 series 102,
coronal slice 46 series 4, and sagittal slice 150 series 6. A mass
arising in the jejunum in this area must be of concern. No similar
changes elsewhere. There is no evident bowel obstruction. The
terminal ileum appears normal. No free air or portal venous air is
apparent.

Vascular/Lymphatic: There is no abdominal aortic aneurysm. There is
aortic and right common iliac artery atherosclerosis. There is no
evident adenopathy in the abdomen or pelvis.

Reproductive: Prostate and seminal vesicles appear normal in size
and contour. No pelvic mass is evident.

Other: Appendix appears normal. There is no abscess or ascites in
the abdomen or pelvis.

Musculoskeletal: There is degenerative change in the lower thoracic
and lumbar regions. There is lumbar levoscoliosis. There is spinal
stenosis at L4-5 due to bony hypertrophy and diffuse disc
protrusion. There is no intramuscular or abdominal wall lesion.
IMPRESSION: 1. There is a focal soft tissue opacity with surrounding oral
contrast involving a loop of mid jejunum in the anterior left mid
abdomen measuring 2.8 x 2.8 cm. A mass arising from the jejunal wall
cannot be excluded given this appearance. Further assessment with
capsule enteroscopy may be advisable in this regard.

2.  Diffuse stool throughout colon consistent with constipation.

3. Postoperative gastric bypass resection without complicating
features in the perioperative region period no demonstrable internal
hernia. No bowel obstruction.

4. Spinal stenosis at L4-5 due to bony hypertrophy and diffuse disc
protrusion.

5. 2 mm nonobstructing calculus lower pole left kidney. No
hydronephrosis on either side. Urinary bladder wall thickness
normal.

6.  No abscess in the abdomen or pelvis.  Appendix appears normal.

7. Prominent liver without focal liver lesion evident. Gallbladder
absent.

These results will be called to the ordering clinician or
representative by the Radiologist Assistant, and communication
documented in the PACS or zVision Dashboard.

## 2020-10-09 ENCOUNTER — Other Ambulatory Visit: Payer: Self-pay | Admitting: Nurse Practitioner

## 2020-10-09 DIAGNOSIS — G8929 Other chronic pain: Secondary | ICD-10-CM

## 2021-02-17 ENCOUNTER — Other Ambulatory Visit: Payer: Self-pay | Admitting: Surgery

## 2021-02-24 ENCOUNTER — Other Ambulatory Visit
Admission: RE | Admit: 2021-02-24 | Discharge: 2021-02-24 | Disposition: A | Discharge status: Home / Self Care | Payer: 59 | Source: Ambulatory Visit | Attending: Surgery | Admitting: Surgery

## 2021-02-24 ENCOUNTER — Other Ambulatory Visit: Payer: Self-pay

## 2021-02-24 NOTE — Patient Instructions (Addendum)
INSTRUCTIONS FOR SURGERY     Your surgery is scheduled for:   Tuesday, April 26TH     To find out your arrival time for the day of surgery,          please call [phone] between 1 pm and 3 pm on : Monday, April 25TH     When you arrive for surgery, report to the [facility].      ONCE REGISTRATION PROCESS IS COMPLETE, YOU WILL GO TO THE        SECOND FLOOR AND SIGN IN AT THE SURGICAL DESK.     REMEMBER: Instructions that are not followed completely may result in serious medical risk,  up to and including death, or upon the discretion of your surgeon and anesthesiologist,            your surgery may need to be rescheduled.  __X__ 1. Do not eat food after midnight the night before your procedure.                    No gum, candy, lozenger, tic tacs, tums or hard candies.                  ABSOLUTELY NOTHING SOLID IN YOUR MOUTH AFTER MIDNIGHT                    You may drink unlimited clear liquids up to 2 hours before you are scheduled to arrive for surgery.                   Do not drink anything within those 2 hours unless you need to take medicine, then take the                   smallest amount you need.  Clear liquids include:  water, apple juice without pulp,                   any flavor Gatorade, Black coffee, black tea.  Sugar may be added but no dairy/ honey /lemon.                        Broth and jello is not considered a clear liquid.  __x__  2. On the morning of surgery, please brush your teeth with toothpaste and water. You may rinse with                  mouthwash if you wish but DO NOT SWALLOW TOOTHPASTE OR MOUTHWASH  __X___3. NO alcohol for 24 hours before or after surgery.  __x___ 4.  Do NOT smoke or use e-cigarettes for 24 HOURS PRIOR TO SURGERY.                      DO NOT Use any chewable tobacco products for at least 6 hours prior to surgery.  __x___ 5. If you start any new medication  after this appointment and prior to surgery, please                   Bring it with you on  the day of surgery.  ___x__ 6. Notify your doctor if there is any change in your medical condition, such as fever,                   infection, vomitting, diarrhea or any open sores.  __x___ 7.  USE the CHG SOAP as instructed, the night before surgery and the day of surgery.                   Once you have washed with this soap, do NOT use any of the following: Powders, perfumes                    or lotions. Please do not wear make up, hairpins, clips or nail polish. You MAY NOT wear deodorant.                   Men may shave their face and neck.  Women need to shave 48 hours prior to surgery.                   DO NOT wear ANY jewelry on the day of surgery. If there are rings that are too tight to                    remove easily, please address this prior to the surgery day. Piercings need to be removed.                                                                     NO METAL ON YOUR BODY.                    Do NOT bring any valuables.  If you came to Pre-Admit testing then you will not need license,                     insurance card or credit card.  If you will be staying overnight, please either leave your things in                     the car or have your family be responsible for these items.                     [facility] IS NOT RESPONSIBLE FOR BELONGINGS OR VALUABLES.  ___X__ 8. DO NOT wear contact lenses on surgery day.  You may not have dentures,                     Hearing aides, contacts or glasses in the operating room. These items can be                    Placed in the Recovery Room to receive immediately after surgery.  __x___ 9. IF YOU ARE SCHEDULED TO GO HOME ON THE SAME DAY, YOU MUST                   Have someone to drive you home and to stay with you  for the first 24 hours.  Have an arrangement prior to arriving on surgery day.  ___x__ 10. Take the  following medications on the morning of surgery with a sip of water:                              1. VENTOLIN INHALER                     2. DEXLANSOPRAZOLE                     3. PROTONIX                     4. NEURONTIN                     5. SEROQUEL                     6. TOPROL                     7. NUCYNTA                     8. EAR DROPS AND EAR OINTMENT, if needed                     9. PENLAC OINTMENT  __X___ 11.  Follow any instructions provided to you by your surgeon.                        Such as enema, clear liquid bowel prep                       PLEASE COMPLETE THE PRESURGICAL DRINK BY THE TIME                        YOU ARE ARRIVING AT [place].  __X__  12. STOP ALL ASPIRIN PRODUCTS AS OF TODAY, 02/24/21                       THIS INCLUDES BC POWDERS / GOODIES POWDER  __x___ 13. STOP Anti-inflammatories as of TODAY, 02/24/21.                      This includes IBUPROFEN / MOTRIN / ADVIL / ALEVE/ NAPROXYN / VOLTAREN                    YOU MAY TAKE TYLENOL ANY TIME PRIOR TO SURGERY.  __X___ 14.  Stop supplements until after surgery.                     This includes: VITAMIN B 6 / FERROUS SULFATE (iron) /                  You may continue taking Vitamin B12 / Vitamin D3 but do not take on the morning of surgery.  _X_____16.  Stop Metformin 2 full days prior to surgery.  LAST DOSE ON April 23RD                           _X_____17.  Continue to take the following medications but do not take on the morning of surgery:  COLACE // CARAFATE // VITAMIN D // WAL-ZYR D // AND DAYVIGO IF YOU GO TO BED                           TOOO LATE //   __X____18. Wear clean and comfortable clothing to the hospital.                     HAVE A STRETCHY SHIRT TO WEAR HOME OR A T SHIRT THAT THE STAFF CAN HELP                        WITH WHILE REARRANGING THE SLING.  CONTINUE TAKING YOUR EVENING MEDICINES AS USUAL:  XYZAL / QUILIPTA / SERTRALINE      // PEPCID  // SYMPROIC // PENLAC // EAR MEDS // EYE DROPS.  ALSO, USE THE MIGRAINE MEDS     IF NEEDED PRIOR TO COMING FOR SURGERY.  BRING YOUR CELL PHONE AND CHARGER. HAVE PHONE NUMBERS FOR YOUR CONTACTS.  BRING MEDICAL POWER OF ATTORNEY/LIVING WILL PAPERS IF YOU CAN FIND THEM.

## 2021-02-24 NOTE — Pre-Procedure Instructions (Signed)
Contacted patient via phone today to review his medical history and his medications.  Patient is very well educated on his meds, side effects and uses.  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  He also takes Metoprolol to aid in his chronic migraine relief.  His various medications for chronic pain are spaced out during the day, along with PRN meds for relief of shoulder, neck, back pain.

## 2021-02-27 ENCOUNTER — Other Ambulatory Visit
Admission: RE | Admit: 2021-02-27 | Discharge: 2021-02-27 | Disposition: A | Discharge status: Home / Self Care | Payer: 59 | Source: Ambulatory Visit | Attending: Surgery | Admitting: Surgery

## 2021-02-27 ENCOUNTER — Other Ambulatory Visit: Payer: Self-pay

## 2021-02-27 DIAGNOSIS — Z01818 Encounter for other preprocedural examination: Secondary | ICD-10-CM | POA: Diagnosis present

## 2021-02-27 DIAGNOSIS — Z20822 Contact with and (suspected) exposure to covid-19: Secondary | ICD-10-CM | POA: Diagnosis not present

## 2021-02-27 LAB — SARS CORONAVIRUS 2 (TAT 6-24 HRS): SARS Coronavirus 2: NEGATIVE

## 2021-02-27 LAB — BASIC METABOLIC PANEL
Anion gap: 9 (ref 5–15)
BUN: 10 mg/dL (ref 6–20)
CO2: 31 mmol/L (ref 22–32)
Calcium: 9.2 mg/dL (ref 8.9–10.3)
Chloride: 102 mmol/L (ref 98–111)
Creatinine, Ser: 0.7 mg/dL (ref 0.61–1.24)
GFR, Estimated: >60 mL/min (ref >60)
Glucose, Bld: 105 mg/dL — ABNORMAL HIGH (ref 70–99)
Potassium: 4.1 mmol/L (ref 3.5–5.1)
Sodium: 142 mmol/L (ref 135–145)

## 2021-02-27 LAB — CBC
HCT: 44.2 % (ref 39.0–52.0)
Hemoglobin: 15.3 g/dL (ref 13.0–17.0)
MCH: 33.9 pg (ref 26.0–34.0)
MCHC: 34.6 g/dL (ref 30.0–36.0)
MCV: 98 fL (ref 80.0–100.0)
Platelets: 170 10*3/uL (ref 150–400)
RBC: 4.51 MIL/uL (ref 4.22–5.81)
RDW: 13.1 % (ref 11.5–15.5)
WBC: 9.1 10*3/uL (ref 4.0–10.5)
nRBC: 0 % (ref 0.0–0.2)

## 2021-03-02 ENCOUNTER — Other Ambulatory Visit: Payer: Self-pay | Admitting: General Surgery

## 2021-03-02 DIAGNOSIS — R1033 Periumbilical pain: Secondary | ICD-10-CM

## 2021-03-02 MED ORDER — CEFAZOLIN IN SODIUM CHLORIDE 3-0.9 GM/100ML-% IV SOLN
3.0000 g | INTRAVENOUS | Status: DC
  Filled 2021-03-02: qty 100

## 2021-03-02 MED ORDER — DEXTROSE 5 % IV SOLN
3.0000 g | INTRAVENOUS | Status: AC
  Administered 2021-03-03: 3 g via INTRAVENOUS
  Filled 2021-03-02: qty 3

## 2021-03-02 MED ORDER — CHLORHEXIDINE GLUCONATE 0.12 % MT SOLN: 15.0000 mL | Freq: Once | OROMUCOSAL | Status: AC

## 2021-03-02 MED ORDER — LACTATED RINGERS IV SOLN: INTRAVENOUS | Status: DC

## 2021-03-02 MED ORDER — ORAL CARE MOUTH RINSE: 15.0000 mL | Freq: Once | OROMUCOSAL | Status: AC

## 2021-03-03 ENCOUNTER — Ambulatory Visit
Admission: RE | Admit: 2021-03-03 | Discharge: 2021-03-03 | Disposition: A | Discharge status: Home / Self Care | Payer: 59 | Source: Ambulatory Visit | Attending: Surgery | Admitting: Surgery

## 2021-03-03 ENCOUNTER — Encounter
Admission: RE | Disposition: A | Discharge status: Home / Self Care | Payer: Self-pay | Source: Ambulatory Visit | Attending: Surgery

## 2021-03-03 ENCOUNTER — Encounter: Payer: Self-pay | Admitting: Surgery

## 2021-03-03 ENCOUNTER — Ambulatory Visit: Payer: 59 | Admitting: Certified Registered"

## 2021-03-03 ENCOUNTER — Ambulatory Visit: Payer: 59

## 2021-03-03 DIAGNOSIS — M25811 Other specified joint disorders, right shoulder: Secondary | ICD-10-CM | POA: Diagnosis not present

## 2021-03-03 DIAGNOSIS — Z596 Low income: Secondary | ICD-10-CM | POA: Diagnosis not present

## 2021-03-03 DIAGNOSIS — Z881 Allergy status to other antibiotic agents status: Secondary | ICD-10-CM | POA: Diagnosis not present

## 2021-03-03 DIAGNOSIS — F17210 Nicotine dependence, cigarettes, uncomplicated: Secondary | ICD-10-CM | POA: Diagnosis not present

## 2021-03-03 DIAGNOSIS — Z888 Allergy status to other drugs, medicaments and biological substances status: Secondary | ICD-10-CM | POA: Insufficient documentation

## 2021-03-03 DIAGNOSIS — Z7984 Long term (current) use of oral hypoglycemic drugs: Secondary | ICD-10-CM | POA: Diagnosis not present

## 2021-03-03 DIAGNOSIS — S43431A Superior glenoid labrum lesion of right shoulder, initial encounter: Secondary | ICD-10-CM | POA: Insufficient documentation

## 2021-03-03 DIAGNOSIS — Z5941 Food insecurity: Secondary | ICD-10-CM | POA: Diagnosis not present

## 2021-03-03 DIAGNOSIS — Z91040 Latex allergy status: Secondary | ICD-10-CM | POA: Diagnosis not present

## 2021-03-03 DIAGNOSIS — Z79899 Other long term (current) drug therapy: Secondary | ICD-10-CM | POA: Insufficient documentation

## 2021-03-03 DIAGNOSIS — X58XXXA Exposure to other specified factors, initial encounter: Secondary | ICD-10-CM | POA: Insufficient documentation

## 2021-03-03 DIAGNOSIS — M75111 Incomplete rotator cuff tear or rupture of right shoulder, not specified as traumatic: Secondary | ICD-10-CM | POA: Insufficient documentation

## 2021-03-03 DIAGNOSIS — Z886 Allergy status to analgesic agent status: Secondary | ICD-10-CM | POA: Insufficient documentation

## 2021-03-03 DIAGNOSIS — M7521 Bicipital tendinitis, right shoulder: Secondary | ICD-10-CM | POA: Insufficient documentation

## 2021-03-03 DIAGNOSIS — Z791 Long term (current) use of non-steroidal anti-inflammatories (NSAID): Secondary | ICD-10-CM | POA: Diagnosis not present

## 2021-03-03 DIAGNOSIS — M19011 Primary osteoarthritis, right shoulder: Secondary | ICD-10-CM | POA: Diagnosis not present

## 2021-03-03 DIAGNOSIS — Z419 Encounter for procedure for purposes other than remedying health state, unspecified: Secondary | ICD-10-CM

## 2021-03-03 HISTORY — PX: SHOULDER ARTHROSCOPY WITH SUBACROMIAL DECOMPRESSION, ROTATOR CUFF REPAIR AND BICEP TENDON REPAIR: SHX5687

## 2021-03-03 SURGERY — SHOULDER ARTHROSCOPY WITH SUBACROMIAL DECOMPRESSION, ROTATOR CUFF REPAIR AND BICEP TENDON REPAIR
Anesthesia: General | Site: Shoulder | Laterality: Right

## 2021-03-03 MED ORDER — ONDANSETRON HCL 4 MG/2ML IJ SOLN
INTRAMUSCULAR | Status: DC | PRN
  Administered 2021-03-03: 4 mg via INTRAVENOUS

## 2021-03-03 MED ORDER — KETAMINE HCL 50 MG/5ML IJ SOSY
PREFILLED_SYRINGE | INTRAMUSCULAR | Status: AC
  Filled 2021-03-03: qty 5

## 2021-03-03 MED ORDER — SUCCINYLCHOLINE CHLORIDE 200 MG/10ML IV SOSY
PREFILLED_SYRINGE | INTRAVENOUS | Status: AC
  Filled 2021-03-03: qty 10

## 2021-03-03 MED ORDER — ONDANSETRON HCL 4 MG/2ML IJ SOLN: 4.0000 mg | Freq: Once | INTRAMUSCULAR | Status: DC | PRN

## 2021-03-03 MED ORDER — ACETAMINOPHEN 10 MG/ML IV SOLN
INTRAVENOUS | Status: AC
  Filled 2021-03-03: qty 100

## 2021-03-03 MED ORDER — LIDOCAINE HCL (PF) 2 % IJ SOLN
INTRAMUSCULAR | Status: AC
  Filled 2021-03-03: qty 5

## 2021-03-03 MED ORDER — SEVOFLURANE IN SOLN
RESPIRATORY_TRACT | Status: AC
  Filled 2021-03-03: qty 250

## 2021-03-03 MED ORDER — BUPIVACAINE LIPOSOME 1.3 % IJ SUSP
INTRAMUSCULAR | Status: DC | PRN
  Administered 2021-03-03: 20 mL via PERINEURAL

## 2021-03-03 MED ORDER — BUPIVACAINE HCL (PF) 0.5 % IJ SOLN
INTRAMUSCULAR | Status: AC
  Filled 2021-03-03: qty 10

## 2021-03-03 MED ORDER — LACTATED RINGERS IV SOLN
INTRAVENOUS | Status: DC | PRN
  Administered 2021-03-03 (×2): 3000 mL

## 2021-03-03 MED ORDER — SODIUM CHLORIDE 0.9 % IV SOLN
INTRAVENOUS | Status: DC | PRN
  Administered 2021-03-03: 25 ug/min via INTRAVENOUS

## 2021-03-03 MED ORDER — FENTANYL CITRATE (PF) 100 MCG/2ML IJ SOLN
INTRAMUSCULAR | Status: AC
  Filled 2021-03-03: qty 2

## 2021-03-03 MED ORDER — ROCURONIUM BROMIDE 10 MG/ML (PF) SYRINGE
PREFILLED_SYRINGE | INTRAVENOUS | Status: AC
  Filled 2021-03-03: qty 10

## 2021-03-03 MED ORDER — FENTANYL CITRATE (PF) 100 MCG/2ML IJ SOLN
INTRAMUSCULAR | Status: AC
  Administered 2021-03-03: 50 ug via INTRAVENOUS
  Filled 2021-03-03: qty 2

## 2021-03-03 MED ORDER — ONDANSETRON HCL 4 MG/2ML IJ SOLN
INTRAMUSCULAR | Status: AC
  Filled 2021-03-03: qty 2

## 2021-03-03 MED ORDER — SUGAMMADEX SODIUM 200 MG/2ML IV SOLN
INTRAVENOUS | Status: DC | PRN
  Administered 2021-03-03: 200 mg via INTRAVENOUS

## 2021-03-03 MED ORDER — LIDOCAINE HCL (CARDIAC) PF 100 MG/5ML IV SOSY
PREFILLED_SYRINGE | INTRAVENOUS | Status: DC | PRN
  Administered 2021-03-03: 100 mg via INTRAVENOUS

## 2021-03-03 MED ORDER — ACETAMINOPHEN 10 MG/ML IV SOLN
INTRAVENOUS | Status: DC | PRN
  Administered 2021-03-03: 1000 mg via INTRAVENOUS

## 2021-03-03 MED ORDER — PROPOFOL 10 MG/ML IV BOLUS
INTRAVENOUS | Status: AC
  Filled 2021-03-03: qty 20

## 2021-03-03 MED ORDER — MIDAZOLAM HCL 2 MG/2ML IJ SOLN
INTRAMUSCULAR | Status: DC | PRN
  Administered 2021-03-03: 1 mg via INTRAVENOUS

## 2021-03-03 MED ORDER — DEXMEDETOMIDINE (PRECEDEX) IN NS 20 MCG/5ML (4 MCG/ML) IV SYRINGE
PREFILLED_SYRINGE | INTRAVENOUS | Status: AC
  Filled 2021-03-03: qty 10

## 2021-03-03 MED ORDER — PROPOFOL 10 MG/ML IV BOLUS
INTRAVENOUS | Status: DC | PRN
  Administered 2021-03-03: 200 mg via INTRAVENOUS

## 2021-03-03 MED ORDER — ROCURONIUM BROMIDE 100 MG/10ML IV SOLN
INTRAVENOUS | Status: DC | PRN
  Administered 2021-03-03: 40 mg via INTRAVENOUS
  Administered 2021-03-03 (×4): 20 mg via INTRAVENOUS

## 2021-03-03 MED ORDER — MORPHINE SULFATE 30 MG PO TABS: 30.0000 mg | ORAL_TABLET | ORAL | 0 refills | Status: AC | PRN

## 2021-03-03 MED ORDER — KETAMINE HCL 10 MG/ML IJ SOLN
INTRAMUSCULAR | Status: DC | PRN
  Administered 2021-03-03 (×2): 10 mg via INTRAVENOUS
  Administered 2021-03-03: 20 mg via INTRAVENOUS
  Administered 2021-03-03: 10 mg via INTRAVENOUS

## 2021-03-03 MED ORDER — DEXMEDETOMIDINE (PRECEDEX) IN NS 20 MCG/5ML (4 MCG/ML) IV SYRINGE
PREFILLED_SYRINGE | INTRAVENOUS | Status: DC | PRN
  Administered 2021-03-03 (×2): 8 ug via INTRAVENOUS
  Administered 2021-03-03: 4 ug via INTRAVENOUS

## 2021-03-03 MED ORDER — LIDOCAINE HCL (PF) 1 % IJ SOLN
INTRAMUSCULAR | Status: AC
  Filled 2021-03-03: qty 5

## 2021-03-03 MED ORDER — MIDAZOLAM HCL 2 MG/2ML IJ SOLN
INTRAMUSCULAR | Status: AC
  Filled 2021-03-03: qty 2

## 2021-03-03 MED ORDER — BUPIVACAINE-EPINEPHRINE 0.5% -1:200000 IJ SOLN
INTRAMUSCULAR | Status: DC | PRN
  Administered 2021-03-03: 30 mL

## 2021-03-03 MED ORDER — LIDOCAINE HCL (PF) 1 % IJ SOLN
INTRAMUSCULAR | Status: DC | PRN
  Administered 2021-03-03: 3 mL

## 2021-03-03 MED ORDER — BUPIVACAINE LIPOSOME 1.3 % IJ SUSP
INTRAMUSCULAR | Status: AC
  Filled 2021-03-03: qty 20

## 2021-03-03 MED ORDER — BUPIVACAINE HCL (PF) 0.5 % IJ SOLN
INTRAMUSCULAR | Status: DC | PRN
  Administered 2021-03-03: 10 mL via PERINEURAL

## 2021-03-03 MED ORDER — EPINEPHRINE PF 1 MG/ML IJ SOLN
INTRAMUSCULAR | Status: AC
  Filled 2021-03-03: qty 2

## 2021-03-03 MED ORDER — FENTANYL CITRATE (PF) 100 MCG/2ML IJ SOLN
INTRAMUSCULAR | Status: DC | PRN
  Administered 2021-03-03: 50 ug via INTRAVENOUS
  Administered 2021-03-03 (×2): 25 ug via INTRAVENOUS

## 2021-03-03 MED ORDER — MIDAZOLAM HCL 2 MG/2ML IJ SOLN
INTRAMUSCULAR | Status: AC
  Administered 2021-03-03: 1 mg via INTRAVENOUS
  Filled 2021-03-03: qty 2

## 2021-03-03 MED ORDER — CHLORHEXIDINE GLUCONATE 0.12 % MT SOLN
OROMUCOSAL | Status: AC
  Administered 2021-03-03: 15 mL via OROMUCOSAL
  Filled 2021-03-03: qty 15

## 2021-03-03 MED ORDER — FENTANYL CITRATE (PF) 100 MCG/2ML IJ SOLN
25.0000 ug | INTRAMUSCULAR | Status: DC | PRN
  Administered 2021-03-03 (×2): 25 ug via INTRAVENOUS

## 2021-03-03 MED ORDER — FENTANYL CITRATE (PF) 100 MCG/2ML IJ SOLN
50.0000 ug | Freq: Once | INTRAMUSCULAR | Status: AC
Start: 2021-03-03 — End: 2021-03-03

## 2021-03-03 MED ORDER — MIDAZOLAM HCL 2 MG/2ML IJ SOLN: 1.0000 mg | Freq: Once | INTRAMUSCULAR | Status: AC

## 2021-03-03 MED ORDER — DEXAMETHASONE SODIUM PHOSPHATE 10 MG/ML IJ SOLN
INTRAMUSCULAR | Status: AC
  Filled 2021-03-03: qty 1

## 2021-03-03 MED ORDER — SUCCINYLCHOLINE CHLORIDE 20 MG/ML IJ SOLN
INTRAMUSCULAR | Status: DC | PRN
  Administered 2021-03-03: 140 mg via INTRAVENOUS

## 2021-03-03 SURGICAL SUPPLY — 46 items
ANCH SUT 2 2.9 2 LD TPR NDL (Anchor) ×2 IMPLANT
ANCH SUT 5.5 KNTLS (Anchor) ×1 IMPLANT
ANCHOR HEALICOIL REGEN 5.5 (Anchor) ×2 IMPLANT
ANCHOR JUGGERKNOT WTAP NDL 2.9 (Anchor) ×4 IMPLANT
APL PRP STRL LF DISP 70% ISPRP (MISCELLANEOUS) ×1
BIT DRILL JUGRKNT W/NDL BIT2.9 (DRILL) ×1 IMPLANT
BUR ACROMIONIZER 4.0 (BURR) ×2 IMPLANT
BUR BR 5.5 WIDE MOUTH (BURR) ×2 IMPLANT
CANNULA SHAVER 8MMX76MM (CANNULA) ×2 IMPLANT
CHLORAPREP W/TINT 26 (MISCELLANEOUS) ×2 IMPLANT
COVER MAYO STAND REUSABLE (DRAPES) ×2 IMPLANT
COVER WAND RF STERILE (DRAPES) ×2 IMPLANT
DILATOR 5.5 THREADED HEALICOIL (MISCELLANEOUS) ×2 IMPLANT
DRAPE IMP U-DRAPE 54X76 (DRAPES) ×4 IMPLANT
DRILL JUGGERKNOT W/NDL BIT 2.9 (DRILL) ×2
ELECT CAUTERY BLADE 6.4 (BLADE) ×2 IMPLANT
ELECT REM PT RETURN 9FT ADLT (ELECTROSURGICAL) ×2
ELECTRODE REM PT RTRN 9FT ADLT (ELECTROSURGICAL) ×1 IMPLANT
GAUZE SPONGE 4X4 12PLY STRL (GAUZE/BANDAGES/DRESSINGS) ×2 IMPLANT
GAUZE XEROFORM 1X8 LF (GAUZE/BANDAGES/DRESSINGS) ×2 IMPLANT
GLOVE SRG 8 PF TXTR STRL LF DI (GLOVE) ×1 IMPLANT
GLOVE SURG ENC MOIS LTX SZ7.5 (GLOVE) ×4 IMPLANT
GLOVE SURG ENC MOIS LTX SZ8 (GLOVE) ×4 IMPLANT
GLOVE SURG UNDER LTX SZ8 (GLOVE) ×2 IMPLANT
GLOVE SURG UNDER POLY LF SZ8 (GLOVE) ×2
GOWN STRL REUS W/ TWL LRG LVL3 (GOWN DISPOSABLE) ×1 IMPLANT
GOWN STRL REUS W/ TWL XL LVL3 (GOWN DISPOSABLE) ×1 IMPLANT
GOWN STRL REUS W/TWL LRG LVL3 (GOWN DISPOSABLE) ×2
GOWN STRL REUS W/TWL XL LVL3 (GOWN DISPOSABLE) ×2
IV LACTATED RINGER IRRG 3000ML (IV SOLUTION) ×8
IV LR IRRIG 3000ML ARTHROMATIC (IV SOLUTION) ×4 IMPLANT
MANIFOLD NEPTUNE II (INSTRUMENTS) ×2 IMPLANT
MASK FACE SPIDER DISP (MASK) ×2 IMPLANT
MAT ABSORB  FLUID 56X50 GRAY (MISCELLANEOUS) ×1
MAT ABSORB FLUID 56X50 GRAY (MISCELLANEOUS) ×1 IMPLANT
PACK ARTHROSCOPY SHOULDER (MISCELLANEOUS) ×2 IMPLANT
SLING ARM LRG DEEP (SOFTGOODS) ×2 IMPLANT
SLING ULTRA II LG (MISCELLANEOUS) ×2 IMPLANT
STAPLER SKIN PROX 35W (STAPLE) ×2 IMPLANT
STRAP SAFETY 5IN WIDE (MISCELLANEOUS) ×2 IMPLANT
SUT ETHIBOND 0 MO6 C/R (SUTURE) ×2 IMPLANT
SUT VIC AB 2-0 CT1 27 (SUTURE) ×4
SUT VIC AB 2-0 CT1 TAPERPNT 27 (SUTURE) ×2 IMPLANT
TAPE MICROFOAM 4IN (TAPE) ×2 IMPLANT
TUBING ARTHRO INFLOW-ONLY STRL (TUBING) ×2 IMPLANT
WAND WEREWOLF FLOW 90D (MISCELLANEOUS) ×2 IMPLANT

## 2021-03-03 NOTE — Anesthesia Procedure Notes (Signed)
Procedure Name: Intubation Date/Time: 03/03/2021 11:19 AM Performed by: Fredderick Phenix, CRNA Pre-anesthesia Checklist: Patient identified, Emergency Drugs available, Suction available and Patient being monitored Patient Re-evaluated:Patient Re-evaluated prior to induction Oxygen Delivery Method: Circle system utilized Preoxygenation: Pre-oxygenation with 100% oxygen Induction Type: IV induction Ventilation: Mask ventilation without difficulty Tube type: Oral Number of attempts: 1 Airway Equipment and Method: Stylet and Oral airway Placement Confirmation: ETT inserted through vocal cords under direct vision,  positive ETCO2 and breath sounds checked- equal and bilateral Secured at: 22 cm Tube secured with: Tape Dental Injury: Teeth and Oropharynx as per pre-operative assessment

## 2021-03-03 NOTE — Discharge Instructions (Addendum)
Orthopedic discharge instructions: Keep dressing dry and intact.  May shower after dressing changed on post-op day #4 (Saturday).  Cover staples with Band-Aids after drying off. Apply ice frequently to shoulder. Take pain medication as prescribed when needed.  May supplement with ES Tylenol if necessary. Keep shoulder immobilizer on at all times except may remove for bathing purposes. Follow-up in 10-14 days or as scheduled.  AMBULATORY SURGERY  DISCHARGE INSTRUCTIONS   1) The drugs that you were given will stay in your system until tomorrow so for the next 24 hours you should not:  A) Drive an automobile B) Make any legal decisions C) Drink any alcoholic beverage   2) You may resume regular meals tomorrow.  Today it is better to start with liquids and gradually work up to solid foods.  You may eat anything you prefer, but it is better to start with liquids, then soup and crackers, and gradually work up to solid foods.   3) Please notify your doctor immediately if you have any unusual bleeding, trouble breathing, redness and pain at the surgery site, drainage, fever, or pain not relieved by medication.    4) Additional Instructions:        Please contact your physician with any problems or Same Day Surgery at [phone], Monday through Friday 6 am to 4 pm, or [place] at [facility] number at [phone].      Interscalene Nerve Block with Exparel  1.  For your surgery you have received an Interscalene Nerve Block with Exparel. 2. Nerve Blocks affect many types of nerves, including nerves that control movement, pain and normal sensation.  You may experience feelings such as numbness, tingling, heaviness, weakness or the inability to move your arm or the feeling or sensation that your arm has "fallen asleep". 3. A nerve block with Exparel can last up to 5 days.  Usually the weakness wears off first.  The tingling and heaviness usually wear off next.  Finally you  may start to notice pain.  Keep in mind that this may occur in any order.  Once a nerve block starts to wear off it is usually completely gone within 60 minutes. 4. ISNB may cause mild shortness of breath, a hoarse voice, blurry vision, unequal pupils, or drooping of the face on the same side as the nerve block.  These symptoms will usually resolve with the numbness.  Very rarely the procedure itself can cause mild seizures. 5. If needed, your surgeon will give you a prescription for pain medication.  It will take about 60 minutes for the oral pain medication to become fully effective.  So, it is recommended that you start taking this medication before the nerve block first begins to wear off, or when you first begin to feel discomfort. 6. Take your pain medication only as prescribed.  Pain medication can cause sedation and decrease your breathing if you take more than you need for the level of pain that you have. 7. Nausea is a common side effect of many pain medications.  You may want to eat something before taking your pain medicine to prevent nausea. 8. After an Interscalene nerve block, you cannot feel pain, pressure or extremes in temperature in the effected arm.  Because your arm is numb it is at an increased risk for injury.  To decrease the possibility of injury, please practice the following:  a. While you are awake change the position of your arm frequently to prevent too much pressure on  any one area for prolonged periods of time. b.  If you have a cast or tight dressing, check the color or your fingers every couple of hours.  Call your surgeon with the appearance of any discoloration (white or blue). c. If you are given a sling to wear before you go home, please wear it  at all times until the block has completely worn off.  Do not get up at night without your sling. d. Please contact [facility] or your surgeon if you do not begin to regain sensation after 7 days from the surgery.   Anesthesia may be contacted by calling the Same Day Surgery Department, Mon. through Fri., 6 am to 4 pm at [phone].   e. If you experience any other problems or concerns, please contact your surgeon's office. f. If you experience severe or prolonged shortness of breath go to the nearest emergency department.

## 2021-03-03 NOTE — H&P (Signed)
History of Present Illness:  Spencer Rivera is a 45 y.o. male who presents for a history and physical in preparation for his upcoming right shoulder procedure. The patient notes little change in his symptoms since his last visit 5 weeks ago. He continues to note discomfort in the shoulder rating into the lateral aspect of his upper arm. The symptoms are aggravated with activities at or above shoulder level. He also has occasional discomfort at night.  Current Outpatient Medications: . ADDERALL XR 30 mg XR capsule TK 1 C PO ONCE D IN THE MORNING  . albuterol (PROVENTIL) 2.5 mg /3 mL (0.083 %) nebulizer solution Take 3 mLs (2.5 mg total) by nebulization every 4 (four) hours as needed for Wheezing 75 mL 5  . albuterol (VENTOLIN HFA) 90 mcg/actuation inhaler Inhale 2 inhalations into the lungs every 4 (four) hours as needed for Wheezing 1 each 2  . atogepant 60 mg Tab Take 60 mg by mouth once daily for 180 days 90 tablet 1  . cetirizine-pseudoephedrine (ZYRTEC-D) 5-120 mg tablet Take 1 tablet by mouth 2 (two) times daily  . ciclopirox (PENLAC) 8 % topical nail solution Apply topically nightly as needed Apply over nail and surrounding skin. 6.6 mL 2  . cyanocobalamin (VITAMIN B12) 1,000 mcg/mL injection INJECT 1 ML INTRAMUSCULARLY EVERY 14 DAYS (DISCARD 28 DAYS AFTER FIRST USE) 6 mL 2  . cyclobenzaprine (FLEXERIL) 10 MG tablet Take 1 tablet (10 mg total) by mouth 3 (three) times daily as needed for Muscle spasms for up to 180 days 90 tablet 3  . dexlansoprazole (DEXILANT) 30 mg DR capsule Take 1 capsule (30 mg total) by mouth once daily 90 capsule 1  . diclofenac (VOLTAREN) 50 MG EC tablet Take 1 tablet (50 mg total) by mouth 2 (two) times daily with meals for 180 days 180 tablet 1  . diphenhydrAMINE (BENADRYL) 25 mg capsule Take by mouth  . docusate (COLACE) 100 MG capsule TAKE 1 CAPSULE(100 MG) BY MOUTH TWICE DAILY 180 capsule 3  . famotidine (PEPCID) 40 MG tablet Take 1 tablet (40 mg total)  by mouth nightly 30 tablet 5  . FEROSUL 325 mg (65 mg iron) tablet  . gabapentin (NEURONTIN) 600 MG tablet Take 2 tablets (1,200 mg total) by mouth 3 (three) times daily for 90 days 540 tablet 4  . ketoconazole (NIZORAL) 2 % shampoo APPLY 5 TO 10 ML TO WET SCALP, LATHER, LEAVE ON 3 TO 5 MINUTES, AND RINSE. APPLY TWICE WEEKLY AS NEEDED 360 mL 2  . lemborexant (DAYVIGO) 10 mg Tab Take 1 tablet by mouth every evening  . levocetirizine (XYZAL) 5 MG tablet Take 1 tablet (5 mg total) by mouth every evening 90 tablet 3  . metFORMIN (GLUCOPHAGE-XR) 500 MG XR tablet Take 2 tablets (1,000 mg total) by mouth daily with dinner Needs long acting due to lactose intolerance 180 tablet 3  . metoprolol succinate (TOPROL-XL) 25 MG XL tablet Take 1 tablet (25 mg total) by mouth once daily for 60 days 90 tablet 3  . [START ON 04/21/2021] morphine (MSIR) 30 MG immediate release tablet Take 1 tablet (30 mg total) by mouth every 6 (six) hours as needed for Pain One tab QID PRN pain (max 4 per day) 120 tablet 0  . [START ON 03/22/2021] morphine (MSIR) 30 MG immediate release tablet Take 1 tablet (30 mg total) by mouth every 6 (six) hours as needed for Pain One tab QID PRN pain (max 4 per day) 120 tablet 0  .  morphine (MSIR) 30 MG immediate release tablet Take 1 tablet (30 mg total) by mouth every 6 (six) hours as needed for Pain One tab QID PRN pain (max 4 per day) 120 tablet 0  . mupirocin (BACTROBAN) 2 % ointment APPLY TOPICALLY TO THE AFFECTED AREA ONCE DAILY 22 g 5  . naldemedine (SYMPROIC) 0.2 mg Tab Take 0.2 mg by mouth once daily 30 tablet 3  . naloxone (NARCAN) 4 mg/actuation nasal spray One spray in nostril if patient is not breathing; call 911; if needed repeat after 2 minutes 2 each 1  . neomycin-polymyxin-hydrocortisone (CORTISPORIN) otic solution Place 3 drops into both ears 4 (four) times daily for 10 days 6 mL 0  . NURTEC ODT 75 mg disintegrating tablet DISSOLVE 1 TABLET UNDER THE TONGUE AS NEEDED. MAXIMUM DAILY  DOSE IS 1 TABLET  . olopatadine (PATADAY) 0.2 % ophthalmic solution Place 1 drop into both eyes once daily  . ONETOUCH VERIO test strip Use once daily. Use as instrructed 100 each 2  . orphenadrine (NORFLEX) 100 mg ER tablet Take 1 tablet (100 mg total) by mouth 2 (two) times daily As needed for muscle pain/spams 180 tablet 3  . pantoprazole (PROTONIX) 40 MG DR tablet Take 1 tablet (40 mg total) by mouth 2 (two) times daily 30 min prior to meals. 180 tablet 3  . promethazine (PHENERGAN) 25 MG tablet TAKE 1 TABLET BY MOUTH EVERY 8 HOURS AS NEEDED FOR NAUSEA OR VOMITING 120 tablet 0  . QUEtiapine (SEROQUEL) 100 MG tablet TAKE 1 TABLET BY MOUTH TWICE DAILY 180 tablet 1  . sertraline 150 mg Cap Take 1 capsule by mouth once daily  . sucralfate (CARAFATE) 100 mg/mL suspension  . syringe with needle, safety (BD INTEGRA SYRINGE) 3 mL 25 gauge x 1" Syrg Use twice monthly for B12 injections. 50 Syringe 3  . tadalafiL (CIALIS) 5 MG tablet Take 5 mg by mouth once daily  . [START ON 04/21/2021] tapentadoL (NUCYNTA ER) 200 mg Tb12 Take 1 tablet (200 mg total) by mouth 2 (two) times daily 60 tablet 0  . [START ON 03/22/2021] tapentadoL (NUCYNTA ER) 200 mg Tb12 Take 1 tablet (200 mg total) by mouth 2 (two) times daily 60 tablet 0  . tapentadoL (NUCYNTA ER) 200 mg Tb12 Take 1 tablet (200 mg total) by mouth 2 (two) times daily 60 tablet 0  . tiZANidine (ZANAFLEX) 4 MG tablet Take 2 tablets (8 mg total) by mouth 2 (two) times daily As needed for headaches and neck pain 360 tablet 3  . traZODone (DESYREL) 100 MG tablet TAKE 2 TABLETS BY MOUTH AT NIGHT 180 tablet 3  . VITAMIN B-6 25 MG tablet TAKE 1 TABLET(25 MG) BY MOUTH EVERY DAY 90 tablet 3  . VITAMIN D2 1,250 mcg (50,000 unit) capsule TAKE 1 CAPSULE BY MOUTH TWICE WEEKLY 26 capsule 3  . ZOLMitriptan (ZOMIG-ZMT) 5 MG disintegrating tablet Take 1 tablet (5 mg total) by mouth as needed for Migraine May repeat in 2 hours if needed 24 tablet 3  . sertraline (ZOLOFT) 100  MG tablet Take 1 tablet (100 mg total) by mouth once daily for 180 days Please discontinue previous sertraline prescription. (Patient not taking: Reported on 02/27/2021) 90 tablet 1   Allergies:  . Etodolac Itching, Swelling and Rash  . Lamotrigine Other (See Comments)  CNS DISORDER; "tremors, difficulty sleeping, lower stomach and abdominal discomfort/pain" Other reaction(s): OTHER  . Methocarbamol Itching, Swelling and Rash  . Paroxetine Hcl Itching, Swelling and Rash  "Severe  mental/mood change; restlessness"  . Prednisone Hives, Itching, Swelling and Rash  Tolerated medrol dose pak Other reaction(s): Other (See Comments)  . Topiramate Other (See Comments)  Myoclonic jerks Other reaction(s): Other (See Comments) Myoclonic jerks  . Trintellix [Vortioxetine] Anxiety  Pt stated he felt shaky all over. Did not help pain either  . Bactrim [Sulfamethoxazole-Trimethoprim] Rash  . Belsomra [Suvorexant] Hallucination and Other (See Comments)  hallucination  . Dulera [Mometasone-Formoterol] Hives  . Lactose Other (See Comments)  "Stomach/abdominal cramps; irregular stool" Other reaction(s): OTHER  . Latex Other (See Comments) and Rash  "Burns skin"  . Pregabalin Rash  "Welts and rash with postule bumps" Other reaction(s): HIVES  . Ciprofloxacin Other (See Comments)  Oral cipro led to tongue feeling swollen and burning (had thrush)  . Ipratropium Analogues Swelling  Other reaction(s): SWELLING  . Naproxen Sodium Rash   Past Medical History:  . Allergic state  . Anemia  . Anxiety  . Arthritis 2010  . Asthma without status asthmaticus 03/1989  . Bipolar disorder (CMS-HCC) 01/2002  . Chronic pain  . DDD (degenerative disc disease), lumbar  . Depression  . Fibromyalgia 2013  . GERD (gastroesophageal reflux disease) 02/2008  . MNOTRRNH(657.9) 2007  . Hearing loss  . Low back pain  . Migraine headache  . Neck pain  . Obesity 1990  . Polypharmacy 05/14/2016  Multiple medications  for pain, sleep, mood disorder, and headaches. Followed by multiple specialists  . 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 Sleep apnea  No longer has after weight loss  . Stress fracture of metatarsal bone of right foot with routine healing  . 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 Tremor  . Ulcer 02/2010  . Ulnar neuropathy   Past Surgical History:  . ARTHROSCOPY KNEE W/DEBRIDEMENT/SHAVING ARTICULAR CARTILAGE Right 04/21/2017  Procedure: ARTHROSCOPY, KNEE, SURGICAL; DEBRIDEMENT/SHAVING OF ARTICULAR CARTILAGE (CHONDROPLASTY); Surgeon: Noralee Space, MD; Location: [facility] OR; Service: Orthopedics; Laterality: Right;  . ASPIRATION/INJECTION MAJOR JOINT/BURSA KNEE Bilateral 08/17/2018  Procedure: BILATERAL SYNVISC KNEE INJECTIONS; Surgeon: Marinda Elk, MD; Location: [facility] OR; Service: Anes/ Pain Mgmt; Laterality: Bilateral;  . [place] SURGERY 2010  . CHOLECYSTECTOMY 10/04/2018  Dr Lesli Albee  . COLONOSCOPY N/A 02/09/2016  Procedure: EGD/Colon; Surgeon: Alena Bills, MD; Location: [facility] ENDO/[facility]; Service: General Surgery; Laterality: N/A;  . COLONOSCOPY N/A 06/21/2016  Procedure: COLONOSCOPY, FLEXIBLE; DIAGNOSTIC; Surgeon: Concha Se, MD; Location: [facility] OR; Service: Gastroenterology; Laterality: N/A;  . EGD N/A 02/09/2016  Procedure: EGD / COLONSCOPY / MAC; Surgeon: Alena Bills, MD; Location: [facility] ENDO/BRON[facility]; Service: General Surgery; Laterality: N/A;  . EGD 09/21/2018  Gastritis/Esophagitis/No Repeat/MUS  . HERNIA REPAIR 02/22/2009  . LAPAROSCOPIC GASTRIC BYPASS  . NEUROPLASTY &/OR TRANSPOSITION MEDIAN NERVE AT CARPAL TUNNEL Right 11/04/2017  Procedure: NEUROPLASTY AND/OR TRANSPOSITION; MEDIAN NERVE AT CARPAL TUNNEL; Surgeon: Rennis Golden, MD; Location: [facility] OR; Service: Orthopedics; Laterality: Right;  . NEUROPLASTY ULNAR NERVE AT ELBOW Left 09/27/2019  Procedure: NEUROPLASTY AND/OR TRANSPOSITION;  ULNAR NERVE AT ELBOW; Surgeon: Rennis Golden, MD; Location: [place]; Service: Orthopedics; Laterality: Left;  . NEUROPLASTY ULNAR NERVE AT ELBOW Left 03/03/2020  Procedure: LEFT-NEUROPLASTY/ TRANSPOSITION; ULNAR NERVE AT ELBOW; Surgeon: Rennis Golden, MD; Location: [facility] OR; Service: Orthopedics; Laterality: Left;  . right radial artery 2002  hand/arm went through a window  . SBE N/A 09/18/2020  Procedure: SMALL INTESTINAL ENDOSCOPY, ENTEROSCOPY BEYOND SECOND PORTION OF DUODENUM, NOT INCLUDING ILEUM, DIAGNOSTIC, INCLUDING COLLECTION OF SPECIMEN(S) BY BRUSHING OR WASHING, WHEN PERFORMED; Surgeon:  Wild, Gaspar Skeeters, MD; Location: [place]; Service: Gastroenterology; Laterality: N/A;  . VASCULAR SURGERY 2012   Family History:  . Depression Father  DECEASED  . Lung cancer Father  SMALL CELL SARCOMA  . Sleep apnea Father  DECEASED June 01, 2008  . Low back pain Father  DECEASED  . Cancer Father  DECEASED  . Other Father  Gray  . Anxiety Father  DECEASED June 01, 2008  . Bipolar disorder Father  DECEASED  . Diabetes type Rivera Maternal Grandmother  . Osteoarthritis Maternal Grandmother  . Rheum arthritis Maternal Grandmother  . Chronic pain Maternal Grandmother  . Diabetes Maternal Grandmother  . Glaucoma Maternal Grandmother  . Osteoporosis (Thinning of bones) Maternal Grandmother  . Stroke Maternal Grandmother  . Arthritis Maternal Grandmother  . Cancer Maternal Grandfather  DECEASED  . Diabetes Paternal Grandmother  DECEASED  . Osteoarthritis Paternal Grandmother  DECEASED  . Diabetes type Rivera Paternal Grandmother  DECEASED  . Rheum arthritis Paternal Grandmother  DECEASED  . Alcohol abuse Paternal Grandmother  . Arthritis Paternal Grandmother  DECEASED  . Cancer Paternal Grandfather  MVA  . Alcohol abuse Maternal Uncle  . Substance Abuse Maternal Uncle  . Glaucoma Maternal Aunt  GREAT AUNT  . Alcohol  abuse Maternal Uncle  . Alcohol abuse Brother  . Substance Abuse Brother  . Lung cancer Paternal Uncle  DECEASED MESOTHELIOMA  . Cancer Paternal Uncle  SMALL CELL SARCOMA - MESOTHELIOMA - DECEASED  . Cancer Paternal Uncle  Small cell sarcoma - Black Lung - Mesothelioma - Paternal Uncle is deceased   Social History:   Socioeconomic History:  Marland Kitchen Marital status: Married  . Highest education level: Some college, no degree  Tobacco Use  . Smoking status: Heavy Tobacco Smoker  Packs/day: 1.00  Years: 32.00  Pack years: 32.00  Types: Cigarettes  . Smokeless tobacco: Never Used  . Tobacco comment: NOT READY TO QUIT  Vaping Use  . Vaping Use: Former  Substance and Sexual Activity  . Alcohol use: Yes  Types: 1 Shots of liquor per week  Comment: 1-2 a month  . Drug use: Never  . Sexual activity: Yes  Partners: Male  Birth control/protection: Other-see comments  Comment: I AM GAY AND I HAVE SEX WITH MY HUSBAND  Other Topics Concern  . Would you please tell us about the people who live in your home, your pets, or anything else important to your social life? No  Social History Armed forces technical officer  Rare alcohol intake  1 pack per day for 25 years  No illicit drug use   Social Determinants of Health:   Emergency planning/management officer Strain: Medium Risk  . Difficulty of Paying Living Expenses: Somewhat hard  Food Insecurity: Food Insecurity Present  . Worried About Charity fundraiser in the Last Year: Sometimes true  . Ran Out of Food in the Last Year: Never true  Transportation Needs: No Transportation Needs  . Lack of Transportation (Medical): No  . Lack of Transportation (Non-Medical): No   Review of Systems:  A comprehensive 14 point ROS was performed, reviewed, and the pertinent orthopaedic findings are documented in the HPI.  Physical Exam: Vitals:  02/27/21 1132  BP: 132/84  Weight: (!) 108.8 kg (239 lb 12.8 oz)  Height: 188 cm (_0 )  PainSc: 7  PainLoc:  Knee   General/Constitutional: The patient appears to be well-nourished, well-developed, and in no acute distress. Neuro/Psych: Normal mood and affect, oriented to person,  place and time. Eyes: Non-icteric. Pupils are equal, round, and reactive to light, and exhibit synchronous movement. ENT: Unremarkable. Lymphatic: No palpable adenopathy. Respiratory: Lungs clear to auscultation, Normal chest excursion, No wheezes and Non-labored breathing Cardiovascular: Regular rate and rhythm. No murmurs. and No edema, swelling or tenderness, except as noted in detailed exam. Integumentary: No impressive skin lesions present, except as noted in detailed exam. Musculoskeletal: Unremarkable, except as noted in detailed exam.  Right shoulder exam: SKIN: Normal SWELLING: None WARMTH: None LYMPH NODES: No adenopathy palpable CREPITUS: None TENDERNESS: Mildly tender over AC joint and lateral acromion, and moderately tender over anterior shoulder region ROM (active):  Forward flexion: 115 degrees Abduction: 110 degrees Internal rotation: Right buttock ROM (passive):  Forward flexion: 140 degrees Abduction: 135 degrees  ER/IR at 90 abd: 85 degrees/50 degrees  He describes moderate pain with all motions.  STRENGTH: Forward flexion: 3+-4/5 Abduction: 3+-4/5 External rotation: 4/5 Internal rotation: 4-4+/5 Pain with RC testing: Mild-moderate pain with resisted strength testing in all planes  STABILITY: Normal  SPECIAL TESTS: Luan Pulling' test: Moderately positive Speed's test: Mildly positive Capsulitis - pain w/ passive ER: No Crossed arm test: Mildly positive Crank: Not evaluated Anterior apprehension: Negative Posterior apprehension: Not evaluated  Grossly, he is neurovascularly intact to the right upper extremity and hand.  Right Shoulder MRI: MRI Shoulder Cartilage: No cartilage abnormality. MRI Shoulder Rotator Cuff: Partial thickness tear of the supraspinatus. No retraction. MRI  Shoulder Labrum / Biceps: No labral tear or biceps abormality. MRI Shoulder Bone: Degenerative changes of AC joint but no degenerative changes of glenohumeral joint  Both the films and report were reviewed by myself and discussed with the patient.  Assessment: . DJD of right AC (acromioclavicular) joint  . Rotator cuff tendinitis, right   Plan: The treatment options were discussed with the patient. In addition, patient educational materials were provided regarding the diagnosis and treatment options. The patient is quite frustrated by his symptoms and functional limitations, and is ready to consider more aggressive treatment options. Therefore, I have recommended a surgical procedure, specifically a right shoulder arthroscopy with debridement, decompression, distal clavicle excision, possible SLAP repair, possible rotator cuff repair, and possible biceps tenodesis. The procedure was discussed with the patient, as were the potential risks (including bleeding, infection, nerve and/or blood vessel injury, persistent or recurrent pain, failure of the repair, progression of arthritis, need for further surgery, blood clots, strokes, heart attacks and/or arhythmias, pneumonia, etc.) and benefits. The patient states his understanding and wishes to proceed. All of the patient's questions and concerns were answered. He understands that this will not take away all of his shoulder pain as he still has a significant portion of his symptoms coming from his neck. He can call any time with further concerns. He will follow up post-surgery, routine.   H&P reviewed and patient re-examined. No changes.

## 2021-03-03 NOTE — Anesthesia Preprocedure Evaluation (Signed)
Anesthesia Evaluation  Patient identified by MRN, date of birth, ID band Patient awake    Reviewed: Allergy & Precautions, NPO status , Patient's Chart, lab work & pertinent test results  History of Anesthesia Complications Negative for: history of anesthetic complications  Airway Mallampati: II  TM Distance: >3 FB Neck ROM: Full    Dental  (+) Poor Dentition, Missing   Pulmonary asthma , Current Smoker and Patient abstained from smoking.,    breath sounds clear to auscultation- rhonchi (-) wheezing      Cardiovascular Exercise Tolerance: Good (-) hypertension(-) CAD, (-) Past MI, (-) Cardiac Stents and (-) CABG  Rhythm:Regular Rate:Normal - Systolic murmurs and - Diastolic murmurs    Neuro/Psych  Headaches, neg Seizures PSYCHIATRIC DISORDERS Anxiety Depression Bipolar Disorder    GI/Hepatic Neg liver ROS, GERD  ,  Endo/Other  negative endocrine ROSneg diabetes  Renal/GU negative Renal ROS     Musculoskeletal  (+) Arthritis , Fibromyalgia -  Abdominal (+) + obese,   Peds  Hematology  (+) anemia ,   Anesthesia Other Findings Past Medical History: No date: Allergic genetic state No date: Anemia No date: Anxiety No date: Arthritis No date: Asthma No date: Bipolar disorder (HCC) No date: Chronic pain No date: DDD (degenerative disc disease), lumbar No date: Depression No date: Fibromyalgia No date: GERD (gastroesophageal reflux disease) No date: H/O gastric bypass No date: Headache     Comment:  migraines, severe since 2009. chronic with minimal               relief No date: Hearing loss No date: Obesity No date: Polypharmacy No date: Stress fracture of metatarsal bone with routine healing No date: Thyrotoxicosis No date: Tremor No date: Ulnar neuropathy   Reproductive/Obstetrics                             Anesthesia Physical Anesthesia Plan  ASA: III  Anesthesia Plan:  General   Post-op Pain Management:  Regional for Post-op pain   Induction: Intravenous  PONV Risk Score and Plan: 0 and Ondansetron and Midazolam  Airway Management Planned: Oral ETT  Additional Equipment:   Intra-op Plan:   Post-operative Plan: Extubation in OR  Informed Consent: I have reviewed the patients History and Physical, chart, labs and discussed the procedure including the risks, benefits and alternatives for the proposed anesthesia with the patient or authorized representative who has indicated his/her understanding and acceptance.     Dental advisory given  Plan Discussed with: CRNA and Anesthesiologist  Anesthesia Plan Comments:         Anesthesia Quick Evaluation

## 2021-03-03 NOTE — Transfer of Care (Signed)
Immediate Anesthesia Transfer of Care Note  Patient: Spencer Rivera  Procedure(s) Performed: RIGHT SHOULDER ARTHROSCOPY WITH DEBRIDEMENT, DECOMPRESSION, ARTHROSCOPIC DISTAL CLAVICLE EXCISION, POSSIBLE SLAP REPAIR, POSSIBLE CLAP REPAIR, ROTATOR CUFF REPAIR, AND BICEPS TENODESIS. (Right Shoulder)  Patient Location: PACU  Anesthesia Type:General  Level of Consciousness: drowsy  Airway & Oxygen Therapy: Patient Spontanous Breathing and Patient connected to face mask oxygen  Post-op Assessment: Report given to RN and Post -op Vital signs reviewed and stable  Post vital signs: Reviewed and stable  Last Vitals:  Vitals Value Taken Time  BP 109/69 03/03/21 1345  Temp    Pulse 75 03/03/21 1347  Resp 23 03/03/21 1347  SpO2 100 % 03/03/21 1347  Vitals shown include unvalidated device data.  Last Pain:  Vitals:   03/03/21 0955  TempSrc:   PainSc: 0-No pain         Complications: No complications documented.

## 2021-03-03 NOTE — Anesthesia Postprocedure Evaluation (Signed)
Anesthesia Post Note  Patient: JC VERON  Procedure(s) Performed: RIGHT SHOULDER ARTHROSCOPY WITH DEBRIDEMENT, DECOMPRESSION, ARTHROSCOPIC DISTAL CLAVICLE EXCISION, POSSIBLE SLAP REPAIR, POSSIBLE CLAP REPAIR, ROTATOR CUFF REPAIR, AND BICEPS TENODESIS. (Right Shoulder)  Patient location during evaluation: PACU Anesthesia Type: General Level of consciousness: awake and alert and oriented Pain management: pain level controlled Vital Signs Assessment: post-procedure vital signs reviewed and stable Respiratory status: spontaneous breathing, nonlabored ventilation and respiratory function stable Cardiovascular status: blood pressure returned to baseline and stable Postop Assessment: no signs of nausea or vomiting Anesthetic complications: no   No complications documented.   Last Vitals:  Vitals:   03/03/21 1445 03/03/21 1500  BP: 130/85 125/78  Pulse: 77 75  Resp: 14 16  Temp: (!) 36.3 C   SpO2: 97% 97%    Last Pain:  Vitals:   03/03/21 1500  TempSrc:   PainSc: 6                  Taja Pentland

## 2021-03-03 NOTE — Anesthesia Procedure Notes (Signed)
Anesthesia Regional Block: Interscalene brachial plexus block   Pre-Anesthetic Checklist: ,, timeout performed, Correct Patient, Correct Site, Correct Laterality, Correct Procedure, Correct Position, site marked, Risks and benefits discussed,  Surgical consent,  Pre-op evaluation,  At surgeon's request and post-op pain management  Laterality: Right  Prep: chloraprep       Needles:  Injection technique: Single-shot  Needle Type: Stimiplex     Needle Length: 10cm  Needle Gauge: 21     Additional Needles:   Procedures:,,,, ultrasound used (permanent image in chart),,,,  Narrative:  Start time: 03/03/2021 9:54 AM End time: 03/03/2021 9:59 AM Injection made incrementally with aspirations every 5 mL.  Performed by: Personally  Anesthesiologist: Emmie Niemann, MD  Additional Notes: Functioning IV was confirmed and monitors were applied.  A Stimuplex needle was used. Sterile prep and drape,hand hygiene and sterile gloves were used.  Negative aspiration and negative test dose prior to incremental administration of local anesthetic. The patient tolerated the procedure well.

## 2021-03-03 NOTE — Op Note (Signed)
03/03/2021  1:20 PM  Patient:   Spencer Rivera  Pre-Op Diagnosis:   Impingement/tendinopathy with partial-thickness rotator cuff tear, SLAP tear, degenerative joint disease of AC joint, and biceps tendinopathy, right shoulder.  Post-Op Diagnosis:   Same  Procedure:   Extensive arthroscopic debridement, arthroscopic excision of distal clavicle, subacromial decompression, mini-open rotator cuff repair, and mini-open biceps tenodesis, right shoulder.  Anesthesia:   General endotracheal with interscalene block using Exparel placed preoperatively by the anesthesiologist.  Surgeon:   Pascal Lux, MD  Assistant:   Cameron Proud, PA-C; Marijean Bravo, PA-S  Findings:   As above.  There was an articular sided partial-thickness tear (approximately 50%) of the anterior insertional fibers of the supraspinatus tendon.  The remainder the rotator cuff was in satisfactory condition.  The biceps tendon demonstrated areas of inflammation without partial or full-thickness tearing.  There was a small stable SLAP tear.  The remainder of the labrum was well-affixed to the glenoid rim.  There were grade 2 chondromalacial changes involving the superior portion of the humeral head and the central portion of the glenoid.  Complications:   None  Fluids:   1100 cc  Estimated blood loss:   10 cc  Tourniquet time:   None  Drains:   None  Closure:   Staples      Brief clinical note:   The patient is a 45 year old male with a history of progressively worsening right shoulder pain. The patient's symptoms have progressed despite medications, activity modification, etc. The patient's history and examination are consistent with impingement/tendinopathy with a rotator cuff tear. These findings were confirmed by MRI scan, which also demonstrated evidence of symptomatic degenerative joint disease of the [facility] joint as well as a probable SLAP tear. The patient presents at this time for definitive management of  his shoulder symptoms.  Procedure:   The patient underwent placement of an interscalene block using Exparel by the anesthesiologist in the preoperative holding area before being brought into the operating room and lain in the supine position. The patient then underwent general endotracheal intubation and anesthesia before being repositioned in the beach chair position using the beach chair positioner. The right shoulder and upper extremity were prepped with ChloraPrep solution before being draped sterilely. Preoperative antibiotics were administered. A timeout was performed to confirm the proper surgical site before the expected portal sites and incision site were injected with 0.5% Sensorcaine with epinephrine.   A posterior portal was created and the glenohumeral joint thoroughly inspected with the findings as described above. An anterior portal was created using an outside-in technique. The labrum and rotator cuff were further probed, again confirming the above-noted findings.  Areas of labral fraying were debrided back to stable margins using the full-radius resector, as were areas of synovitis.  The full-radius resector also was used to debride the articular sided partial-thickness tear of the supraspinatus tendon.  The ArthroCare wand was inserted and used to release the biceps tendon from its labral anchor.  It also was used to obtain hemostasis as well as to "anneal" the labrum superiorly and anteriorly. The instruments were removed from the joint after suctioning the excess fluid.  The camera was repositioned through the posterior portal into the subacromial space. A separate lateral portal was created using an outside-in technique. The 3.5 mm full-radius resector was introduced and used to perform a subtotal bursectomy. The ArthroCare wand was then inserted and used to remove the periosteal tissue off the undersurface of the anterior third of the  acromion as well as to recess the coracoacromial  ligament from its attachment along the anterior and lateral margins of the acromion. The 4.0 mm acromionizing bur was introduced and used to complete the decompression by removing the undersurface of the anterior third of the acromion.   Attention was then directed more medially. The ArthroCare wand was used to denude the undersurface of the distal clavicle to better visualize the structure. The camera was repositioned in the lateral portal and instrumentation performed through the anterior portal. The 5.5 mm acromionizer bur was inserted and used to remove the distal 8 to 10 mm of distal clavicle. The full radius resector was reintroduced to remove any residual bony debris before the ArthroCare wand was reintroduced to obtain hemostasis. The camera was reintroduced through the anterior portal to verify that adequate distal clavicle had been removed and that the resection was adequately carried out posteriorly and superiorly. The instruments were then removed from the subacromial space after suctioning the excess fluid.  An approximately 4-5 cm incision was made over the anterolateral aspect of the shoulder beginning at the anterolateral corner of the acromion and extending distally in line with the bicipital groove. This incision was carried down through the subcutaneous tissues to expose the deltoid fascia. The raphae between the anterior and middle thirds was identified and this plane developed to provide access into the subacromial space. Additional bursal tissues were debrided sharply using Metzenbaum scissors. The rotator cuff tear was carefully inspected and the area of partial-thickness tearing was identified by palpation. The tear was completed by making a short longitudinal incision through the intact fibers of the supraspinatus with a #15 blade and the exposed greater tuberosity roughened with a rongeur. The tear was repaired using a single Biomet 2.9 mm JuggerKnot anchor. These sutures were then  brought back laterally and secured using one 1 [employer] Healicoil knotless RegeneSorb anchor to create a two-layer closure. An apparent watertight closure was obtained.  The bicipital groove was identified by palpation and opened for 1-1.5 cm. The biceps tendon stump was retrieved through this defect. The floor of the bicipital groove was roughened with a curet before another Biomet 2.9 mm JuggerKnot anchor was inserted. Both sets of sutures were passed through the biceps tendon and tied securely to effect the tenodesis. The bicipital sheath was reapproximated using two #0 Ethibond interrupted sutures, incorporating the biceps tendon to further reinforce the tenodesis.  The wound was copiously irrigated with sterile saline solution before the deltoid raphae was reapproximated using 2-0 Vicryl interrupted sutures. The subcutaneous tissues were closed in two layers using 2-0 Vicryl interrupted sutures before the skin was closed using staples. The portal sites also were closed using staples. A sterile bulky dressing was applied to the shoulder before the arm was placed into a shoulder immobilizer. The patient was then awakened, extubated, and returned to the recovery room in satisfactory condition after tolerating the procedure well.

## 2021-03-04 ENCOUNTER — Encounter: Payer: Self-pay | Admitting: Surgery

## 2021-09-14 ENCOUNTER — Other Ambulatory Visit: Payer: Self-pay | Admitting: Surgery

## 2021-09-14 DIAGNOSIS — M1711 Unilateral primary osteoarthritis, right knee: Secondary | ICD-10-CM

## 2021-09-14 DIAGNOSIS — M23300 Other meniscus derangements, unspecified lateral meniscus, right knee: Secondary | ICD-10-CM

## 2021-10-16 ENCOUNTER — Other Ambulatory Visit
Admission: RE | Admit: 2021-10-16 | Discharge: 2021-10-16 | Disposition: A | Discharge status: Home / Self Care | Payer: 59 | Source: Ambulatory Visit | Attending: Sports Medicine | Admitting: Sports Medicine

## 2021-10-16 ENCOUNTER — Other Ambulatory Visit: Payer: Self-pay | Admitting: Surgery

## 2021-10-16 DIAGNOSIS — G8929 Other chronic pain: Secondary | ICD-10-CM | POA: Diagnosis present

## 2021-10-16 DIAGNOSIS — M25461 Effusion, right knee: Secondary | ICD-10-CM | POA: Diagnosis present

## 2021-10-16 DIAGNOSIS — M25561 Pain in right knee: Secondary | ICD-10-CM | POA: Diagnosis present

## 2021-10-16 LAB — SYNOVIAL CELL COUNT + DIFF, W/ CRYSTALS
Crystals, Fluid: NONE SEEN
Eosinophils-Synovial: 0 %
Lymphocytes-Synovial Fld: 54 %
Monocyte-Macrophage-Synovial Fluid: 41 %
Neutrophil, Synovial: 5 %
Other Cells-SYN: 0
WBC, Synovial: 53 /mm3 (ref 0–200)

## 2021-10-27 ENCOUNTER — Encounter
Admission: RE | Admit: 2021-10-27 | Discharge: 2021-10-27 | Disposition: A | Discharge status: Home / Self Care | Payer: 59 | Source: Ambulatory Visit | Attending: Surgery | Admitting: Surgery

## 2021-10-27 ENCOUNTER — Other Ambulatory Visit: Payer: Self-pay

## 2021-10-27 NOTE — Patient Instructions (Addendum)
Your procedure is scheduled on: 11/05/21 Report to [place]. To find out your arrival time please call [phone] between 1PM - 3PM on 11/04/21.  Remember: Instructions that are not followed completely may result in serious medical risk, up to and including death, or upon the discretion of your surgeon and anesthesiologist your surgery may need to be rescheduled.     _X__ 1. Do not eat food after midnight the night before your procedure.                 No gum chewing or hard candies. You may drink clear liquids up to 2 hours                 before you are scheduled to arrive for your surgery- DO not drink clear                 liquids within 2 hours of the start of your surgery.                 Clear Liquids include:  water, apple juice without pulp, clear carbohydrate                 drink such as Clearfast or Gatorade, Black Coffee or Tea (Do not add                 anything to coffee or tea). Diabetics water only  __X__2.  On the morning of surgery brush your teeth with toothpaste and water, you                 may rinse your mouth with mouthwash if you wish.  Do not swallow any              toothpaste of mouthwash.     _X__ 3.  No Alcohol for 24 hours before or after surgery.   _X__ 4.  Do Not Smoke or use e-cigarettes For 24 Hours Prior to Your Surgery.                 Do not use any chewable tobacco products for at least 6 hours prior to                 surgery.  ____  5.  Bring all medications with you on the day of surgery if instructed.   __X__  6.  Notify your doctor if there is any change in your medical condition      (cold, fever, infections).     Do not wear jewelry, make-up, hairpins, clips or nail polish. Do not wear lotions, powders, or perfumes.  Do not shave body hair 48 hours prior to surgery. Men may shave face and neck. Do not bring valuables to the hospital.    [facility] is not responsible for any  belongings or valuables.  Contacts, dentures/partials or body piercings may not be worn into surgery. Bring a case for your contacts, glasses or hearing aids, a denture cup will be supplied. Leave your suitcase in the car. After surgery it may be brought to your room. For patients admitted to the hospital, discharge time is determined by your treatment team.   Patients discharged the day of surgery will not be allowed to drive home.   Please read over the following fact sheets that you were given:     __X__ Take these medicines the morning of surgery with A SIP OF WATER:  1. ARIPiprazole (ABILIFY) 10 MG tablet  2. baclofen (LIORESAL) 20 MG tablet  3. Dexlansoprazole 30 MG capsule  4. gabapentin (NEURONTIN) 1200 MG tablet  5. metoprolol succinate (TOPROL-XL) 25 MG 24 hr tablet  6. morphine (MSIR) 30 MG tablet if needed  7. NUCYNTA ER 200 MG TB12  8. pantoprazole (PROTONIX) 40 MG tablet  ____ Fleet Enema (as directed)   __X__ Use CHG Soap/SAGE wipes as directed  Shower with Chlorhexadine at bedtime an morning of surgery. Liberal use.  __X__ Use inhalers on the day of surgery USE YOUR INHALER THE MORNING OF SURGERY  __X__ Stop metformin/Janumet/Farxiga 2 days prior to surgery  LAST DOSE 12/27 RESUME AFTER SURGERY  ____ Take 1/2 of usual insulin dose the night before surgery. No insulin the morning          of surgery.   ____ Stop Blood Thinners Coumadin/Plavix/Xarelto/Pleta/Pradaxa/Eliquis/Effient/Aspirin  on   Or contact your Surgeon, Cardiologist or Medical Doctor regarding  ability to stop your blood thinners  __X__ Stop Anti-inflammatories 7 days before surgery such as Advil, Ibuprofen, Motrin,  BC or Goodies Powder, Naprosyn, Naproxen, Aleve, Aspirin   Hold Diclofenac/Voltaren 7 days prior to surgery __X__ Stop all herbal supplements, fish oil or vitamin E until after surgery.    ____ Bring C-Pap to the hospital.      How to Use an Incentive Spirometer An incentive  spirometer is a tool that measures how well you are filling your lungs with each breath. Learning to take long, deep breaths using this tool can help you keep your lungs clear and active. This may help to reverse or lessen your chance of developing breathing (pulmonary) problems, especially infection. You may be asked to use a spirometer: After a surgery. If you have a lung problem or a history of smoking. After a long period of time when you have been unable to move or be active. If the spirometer includes an indicator to show the highest number that you have reached, your health care provider or respiratory therapist will help you set a goal. Keep a log of your progress as told by your health care provider. What are the risks? Breathing too quickly may cause dizziness or cause you to pass out. Take your time so you do not get dizzy or light-headed. If you are in pain, you may need to take pain medicine before doing incentive spirometry. It is harder to take a deep breath if you are having pain. How to use your incentive spirometer  Sit up on the edge of your bed or on a chair. Hold the incentive spirometer so that it is in an upright position. Before you use the spirometer, breathe out normally. Place the mouthpiece in your mouth. Make sure your lips are closed tightly around it. Breathe in slowly and as deeply as you can through your mouth, causing the piston or the ball to rise toward the top of the chamber. Hold your breath for 3-5 seconds, or for as long as possible. If the spirometer includes a coach indicator, use this to guide you in breathing. Slow down your breathing if the indicator goes above the marked areas. Remove the mouthpiece from your mouth and breathe out normally. The piston or ball will return to the bottom of the chamber. Rest for a few seconds, then repeat the steps 10 or more times. Take your time and take a few normal breaths between deep breaths so that you do not get  dizzy or light-headed. Do  this every 1-2 hours when you are awake. If the spirometer includes a goal marker to show the highest number you have reached (best effort), use this as a goal to work toward during each repetition. After each set of 10 deep breaths, cough a few times. This will help to make sure that your lungs are clear. If you have an incision on your chest or abdomen from surgery, place a pillow or a rolled-up towel firmly against the incision when you cough. This can help to reduce pain while taking deep breaths and coughing. General tips When you are able to get out of bed: Walk around often. Continue to take deep breaths and cough in order to clear your lungs. Keep using the incentive spirometer until your health care provider says it is okay to stop using it. If you have been in the hospital, you may be told to keep using the spirometer at home. Contact a health care provider if: You are having difficulty using the spirometer. You have trouble using the spirometer as often as instructed. Your pain medicine is not giving enough relief for you to use the spirometer as told. You have a fever. Get help right away if: You develop shortness of breath. You develop a cough with bloody mucus from the lungs. You have fluid or blood coming from an incision site after you cough. Summary An incentive spirometer is a tool that can help you learn to take long, deep breaths to keep your lungs clear and active. You may be asked to use a spirometer after a surgery, if you have a lung problem or a history of smoking, or if you have been inactive for a long period of time. Use your incentive spirometer as instructed every 1-2 hours while you are awake. If you have an incision on your chest or abdomen, place a pillow or a rolled-up towel firmly against your incision when you cough. This will help to reduce pain. Get help right away if you have shortness of breath, you cough up bloody mucus, or  blood comes from your incision when you cough. This information is not intended to replace advice given to you by your health care provider. Make sure you discuss any questions you have with your health care provider. Document Revised: 01/14/2020 Document Reviewed: 01/14/2020 Elsevier Patient Education  [employer].

## 2021-11-05 ENCOUNTER — Other Ambulatory Visit: Payer: Self-pay

## 2021-11-05 ENCOUNTER — Encounter
Admission: RE | Disposition: A | Discharge status: Home / Self Care | Payer: Self-pay | Source: Ambulatory Visit | Attending: Surgery

## 2021-11-05 ENCOUNTER — Encounter: Payer: Self-pay | Admitting: Surgery

## 2021-11-05 ENCOUNTER — Ambulatory Visit: Payer: 59 | Admitting: Anesthesiology

## 2021-11-05 ENCOUNTER — Ambulatory Visit
Admission: RE | Admit: 2021-11-05 | Discharge: 2021-11-05 | Disposition: A | Discharge status: Home / Self Care | Payer: 59 | Source: Ambulatory Visit | Attending: Surgery | Admitting: Surgery

## 2021-11-05 DIAGNOSIS — I10 Essential (primary) hypertension: Secondary | ICD-10-CM | POA: Diagnosis not present

## 2021-11-05 DIAGNOSIS — M797 Fibromyalgia: Secondary | ICD-10-CM | POA: Diagnosis not present

## 2021-11-05 DIAGNOSIS — K219 Gastro-esophageal reflux disease without esophagitis: Secondary | ICD-10-CM | POA: Insufficient documentation

## 2021-11-05 DIAGNOSIS — F319 Bipolar disorder, unspecified: Secondary | ICD-10-CM | POA: Diagnosis not present

## 2021-11-05 DIAGNOSIS — F1120 Opioid dependence, uncomplicated: Secondary | ICD-10-CM | POA: Insufficient documentation

## 2021-11-05 DIAGNOSIS — D649 Anemia, unspecified: Secondary | ICD-10-CM | POA: Diagnosis not present

## 2021-11-05 DIAGNOSIS — F419 Anxiety disorder, unspecified: Secondary | ICD-10-CM | POA: Diagnosis not present

## 2021-11-05 DIAGNOSIS — Z79899 Other long term (current) drug therapy: Secondary | ICD-10-CM | POA: Diagnosis not present

## 2021-11-05 DIAGNOSIS — S83281A Other tear of lateral meniscus, current injury, right knee, initial encounter: Secondary | ICD-10-CM | POA: Diagnosis not present

## 2021-11-05 DIAGNOSIS — X58XXXA Exposure to other specified factors, initial encounter: Secondary | ICD-10-CM | POA: Insufficient documentation

## 2021-11-05 DIAGNOSIS — M25761 Osteophyte, right knee: Secondary | ICD-10-CM | POA: Diagnosis not present

## 2021-11-05 DIAGNOSIS — M1711 Unilateral primary osteoarthritis, right knee: Secondary | ICD-10-CM | POA: Diagnosis not present

## 2021-11-05 DIAGNOSIS — J45909 Unspecified asthma, uncomplicated: Secondary | ICD-10-CM | POA: Insufficient documentation

## 2021-11-05 DIAGNOSIS — M94261 Chondromalacia, right knee: Secondary | ICD-10-CM | POA: Insufficient documentation

## 2021-11-05 DIAGNOSIS — F17210 Nicotine dependence, cigarettes, uncomplicated: Secondary | ICD-10-CM | POA: Insufficient documentation

## 2021-11-05 HISTORY — PX: KNEE ARTHROSCOPY: SHX127

## 2021-11-05 SURGERY — ARTHROSCOPY, KNEE
Anesthesia: General | Site: Knee | Laterality: Right

## 2021-11-05 MED ORDER — ONDANSETRON HCL 4 MG/2ML IJ SOLN: 4.0000 mg | Freq: Four times a day (QID) | INTRAMUSCULAR | Status: DC | PRN

## 2021-11-05 MED ORDER — OXYCODONE HCL 5 MG PO TABS: 5.0000 mg | ORAL_TABLET | ORAL | 0 refills | Status: AC | PRN

## 2021-11-05 MED ORDER — LACTATED RINGERS IV SOLN: INTRAVENOUS | Status: DC

## 2021-11-05 MED ORDER — ONDANSETRON HCL 4 MG/2ML IJ SOLN
INTRAMUSCULAR | Status: DC | PRN
  Administered 2021-11-05: 4 mg via INTRAVENOUS

## 2021-11-05 MED ORDER — LIDOCAINE HCL (PF) 1 % IJ SOLN
INTRAMUSCULAR | Status: DC | PRN
  Administered 2021-11-05: 60 mL via INTRAMUSCULAR

## 2021-11-05 MED ORDER — ONDANSETRON HCL 4 MG/2ML IJ SOLN: 4.0000 mg | Freq: Once | INTRAMUSCULAR | Status: DC | PRN

## 2021-11-05 MED ORDER — DEXAMETHASONE SODIUM PHOSPHATE 10 MG/ML IJ SOLN
INTRAMUSCULAR | Status: AC
  Filled 2021-11-05: qty 1

## 2021-11-05 MED ORDER — SODIUM CHLORIDE 0.9 % IV SOLN: INTRAVENOUS | Status: DC

## 2021-11-05 MED ORDER — LIDOCAINE HCL (CARDIAC) PF 100 MG/5ML IV SOSY
PREFILLED_SYRINGE | INTRAVENOUS | Status: DC | PRN
  Administered 2021-11-05: 100 mg via INTRAVENOUS

## 2021-11-05 MED ORDER — PROPOFOL 10 MG/ML IV BOLUS
INTRAVENOUS | Status: DC | PRN
  Administered 2021-11-05: 250 mg via INTRAVENOUS

## 2021-11-05 MED ORDER — DEXAMETHASONE SODIUM PHOSPHATE 10 MG/ML IJ SOLN
INTRAMUSCULAR | Status: DC | PRN
  Administered 2021-11-05: 10 mg via INTRAVENOUS

## 2021-11-05 MED ORDER — CEFAZOLIN SODIUM-DEXTROSE 2-4 GM/100ML-% IV SOLN
2.0000 g | INTRAVENOUS | Status: AC
  Administered 2021-11-05: 16:00:00 2 g via INTRAVENOUS

## 2021-11-05 MED ORDER — VASOPRESSIN 20 UNIT/ML IV SOLN
INTRAVENOUS | Status: AC
  Filled 2021-11-05: qty 1

## 2021-11-05 MED ORDER — MEPERIDINE HCL 25 MG/ML IJ SOLN: 6.2500 mg | INTRAMUSCULAR | Status: DC | PRN

## 2021-11-05 MED ORDER — ONDANSETRON HCL 4 MG PO TABS: 4.0000 mg | ORAL_TABLET | Freq: Four times a day (QID) | ORAL | Status: DC | PRN

## 2021-11-05 MED ORDER — PHENYLEPHRINE HCL (PRESSORS) 10 MG/ML IV SOLN
INTRAVENOUS | Status: DC | PRN
  Administered 2021-11-05: 100 ug via INTRAVENOUS
  Administered 2021-11-05: 150 ug via INTRAVENOUS

## 2021-11-05 MED ORDER — MIDAZOLAM HCL 2 MG/2ML IJ SOLN
INTRAMUSCULAR | Status: AC
  Filled 2021-11-05: qty 2

## 2021-11-05 MED ORDER — CHLORHEXIDINE GLUCONATE 0.12 % MT SOLN
OROMUCOSAL | Status: AC
  Administered 2021-11-05: 14:00:00 15 mL via OROMUCOSAL
  Filled 2021-11-05: qty 15

## 2021-11-05 MED ORDER — ORAL CARE MOUTH RINSE: 15.0000 mL | Freq: Once | OROMUCOSAL | Status: AC

## 2021-11-05 MED ORDER — LIDOCAINE HCL (PF) 1 % IJ SOLN
INTRAMUSCULAR | Status: AC
  Filled 2021-11-05: qty 30

## 2021-11-05 MED ORDER — VASOPRESSIN 20 UNIT/ML IV SOLN
INTRAVENOUS | Status: DC | PRN
  Administered 2021-11-05 (×2): 2 [IU] via INTRAVENOUS

## 2021-11-05 MED ORDER — CHLORHEXIDINE GLUCONATE 0.12 % MT SOLN: 15.0000 mL | Freq: Once | OROMUCOSAL | Status: AC

## 2021-11-05 MED ORDER — RINGERS IRRIGATION IR SOLN
Status: DC | PRN
  Administered 2021-11-05: 1000 mL
  Administered 2021-11-05: 3000 mL

## 2021-11-05 MED ORDER — FENTANYL CITRATE (PF) 100 MCG/2ML IJ SOLN
INTRAMUSCULAR | Status: AC
  Filled 2021-11-05: qty 2

## 2021-11-05 MED ORDER — FENTANYL CITRATE (PF) 100 MCG/2ML IJ SOLN
INTRAMUSCULAR | Status: DC | PRN
  Administered 2021-11-05: 100 ug via INTRAVENOUS

## 2021-11-05 MED ORDER — MIDAZOLAM HCL 2 MG/2ML IJ SOLN
INTRAMUSCULAR | Status: DC | PRN
  Administered 2021-11-05: 2 mg via INTRAVENOUS

## 2021-11-05 MED ORDER — ONDANSETRON HCL 4 MG/2ML IJ SOLN
INTRAMUSCULAR | Status: AC
  Filled 2021-11-05: qty 2

## 2021-11-05 MED ORDER — BUPIVACAINE-EPINEPHRINE (PF) 0.5% -1:200000 IJ SOLN
INTRAMUSCULAR | Status: DC | PRN
  Administered 2021-11-05: 20 mL

## 2021-11-05 MED ORDER — METOCLOPRAMIDE HCL 10 MG PO TABS: 5.0000 mg | ORAL_TABLET | Freq: Three times a day (TID) | ORAL | Status: DC | PRN

## 2021-11-05 MED ORDER — FENTANYL CITRATE (PF) 100 MCG/2ML IJ SOLN: 25.0000 ug | INTRAMUSCULAR | Status: DC | PRN

## 2021-11-05 MED ORDER — BUPIVACAINE-EPINEPHRINE (PF) 0.5% -1:200000 IJ SOLN
INTRAMUSCULAR | Status: AC
  Filled 2021-11-05: qty 60

## 2021-11-05 MED ORDER — OXYCODONE HCL 5 MG PO TABS: 10.0000 mg | ORAL_TABLET | ORAL | Status: DC | PRN

## 2021-11-05 MED ORDER — METOCLOPRAMIDE HCL 5 MG/ML IJ SOLN: 5.0000 mg | Freq: Three times a day (TID) | INTRAMUSCULAR | Status: DC | PRN

## 2021-11-05 MED ORDER — CEFAZOLIN SODIUM-DEXTROSE 2-4 GM/100ML-% IV SOLN
INTRAVENOUS | Status: AC
  Filled 2021-11-05: qty 100

## 2021-11-05 SURGICAL SUPPLY — 41 items
APL PRP STRL LF DISP 70% ISPRP (MISCELLANEOUS) ×1
BAG COUNTER SPONGE SURGICOUNT (BAG) IMPLANT
BAG SPNG CNTER NS LX DISP (BAG)
BAG SURGICOUNT SPONGE COUNTING (BAG)
BLADE FULL RADIUS 3.5 (BLADE) ×3 IMPLANT
BLADE SHAVER 4.5X7 STR FR (MISCELLANEOUS) ×1 IMPLANT
BNDG ELASTIC 6X5.8 VLCR STR LF (GAUZE/BANDAGES/DRESSINGS) ×3 IMPLANT
BNDG ESMARK 6X12 TAN STRL LF (GAUZE/BANDAGES/DRESSINGS) ×3 IMPLANT
CHLORAPREP W/TINT 26 (MISCELLANEOUS) ×3 IMPLANT
CUFF TOURN SGL QUICK 24 (TOURNIQUET CUFF)
CUFF TOURN SGL QUICK 34 (TOURNIQUET CUFF)
CUFF TRNQT CYL 24X4X16.5-23 (TOURNIQUET CUFF) IMPLANT
CUFF TRNQT CYL 34X4.125X (TOURNIQUET CUFF) IMPLANT
DRAPE ARTHRO LIMB 89X125 STRL (DRAPES) ×3 IMPLANT
DRAPE IMP U-DRAPE 54X76 (DRAPES) ×3 IMPLANT
ELECT REM PT RETURN 9FT ADLT (ELECTROSURGICAL) ×3
ELECTRODE REM PT RTRN 9FT ADLT (ELECTROSURGICAL) ×1 IMPLANT
GAUZE SPONGE 4X4 12PLY STRL (GAUZE/BANDAGES/DRESSINGS) ×3 IMPLANT
GLOVE SURG ENC MOIS LTX SZ8 (GLOVE) ×6 IMPLANT
GLOVE SURG ENC TEXT LTX SZ7 (GLOVE) ×6 IMPLANT
GLOVE SURG UNDER LTX SZ8 (GLOVE) ×3 IMPLANT
GLOVE SURG UNDER POLY LF SZ7.5 (GLOVE) ×3 IMPLANT
GOWN STRL REUS W/ TWL LRG LVL3 (GOWN DISPOSABLE) ×1 IMPLANT
GOWN STRL REUS W/ TWL XL LVL3 (GOWN DISPOSABLE) ×2 IMPLANT
GOWN STRL REUS W/TWL LRG LVL3 (GOWN DISPOSABLE) ×3
GOWN STRL REUS W/TWL XL LVL3 (GOWN DISPOSABLE) ×6
IV LACTATED RINGER IRRG 3000ML (IV SOLUTION) ×3
IV LR IRRIG 3000ML ARTHROMATIC (IV SOLUTION) ×1 IMPLANT
KIT TURNOVER KIT A (KITS) ×3 IMPLANT
MANIFOLD NEPTUNE II (INSTRUMENTS) ×4 IMPLANT
NDL HYPO 21X1.5 SAFETY (NEEDLE) ×1 IMPLANT
NEEDLE HYPO 21X1.5 SAFETY (NEEDLE) ×3 IMPLANT
PACK ARTHROSCOPY KNEE (MISCELLANEOUS) ×3 IMPLANT
PENCIL ELECTRO HAND CTR (MISCELLANEOUS) ×3 IMPLANT
SPONGE T-LAP 18X18 ~~LOC~~+RFID (SPONGE) ×3 IMPLANT
SUT PROLENE 4 0 PS 2 18 (SUTURE) ×3 IMPLANT
SUT TICRON COATED BLUE 2 0 30 (SUTURE) IMPLANT
SYR 50ML LL SCALE MARK (SYRINGE) ×3 IMPLANT
TUBING INFLOW SET DBFLO PUMP (TUBING) ×3 IMPLANT
WAND WEREWOLF FLOW 90D (MISCELLANEOUS) ×3 IMPLANT
WATER STERILE IRR 500ML POUR (IV SOLUTION) ×3 IMPLANT

## 2021-11-05 NOTE — Anesthesia Postprocedure Evaluation (Signed)
Anesthesia Post Note  Patient: MAURIZIO GENO  Procedure(s) Performed: RIGHT KNEE ARTHROSCOPY WITH DEBRIDEMENT AND PARTIAL LATERAL MENISCECTOMY, ABRASION CHONDROPLASTY OF THE FEMORAL TROCHLEA, EXTENSIVE SYNOVECTOMY (Right: Knee)  Patient location during evaluation: PACU Anesthesia Type: General Level of consciousness: awake and alert Pain management: pain level controlled Vital Signs Assessment: post-procedure vital signs reviewed and stable Respiratory status: spontaneous breathing, nonlabored ventilation, respiratory function stable and patient connected to nasal cannula oxygen Cardiovascular status: blood pressure returned to baseline and stable Postop Assessment: no apparent nausea or vomiting Anesthetic complications: no   No notable events documented.   Last Vitals:  Vitals:   11/05/21 1747 11/05/21 1808  BP: 131/83 133/78  Pulse: 100 93  Resp: (!) 24 18  Temp:  (!) 36.1 C  SpO2: 97% 96%    Last Pain:  Vitals:   11/05/21 1808  TempSrc: Temporal  PainSc: 0-No pain                 Martha Clan

## 2021-11-05 NOTE — Op Note (Signed)
11/05/2021  5:09 PM  Patient:   Spencer Rivera  Pre-Op Diagnosis:   Complex lateral meniscus tear with underlying degenerative joint disease and chronic effusion, right knee.  Postoperative diagnosis:   Same  Procedure:   Arthroscopic partial lateral meniscectomy, extensive synovectomy, and abrasion chondroplasty of grade III chondromalacia femoral trochlea, right knee.  Surgeon:   Pascal Lux, MD  Anesthesia:   General LMA  Findings:   As above. In addition to the area of grade III chondromalacia involving the central portion of the femoral trochlea, there were extensive grade II-III chondromalacial changes involving the lateral femoral condyle and lateral tibial plateau, and grade ll chondromalacial changes involving the medial femoral condyle and the patella. The medial tibial plateau was in satisfactory condition, as well as the medial meniscus. The anterior posterior cruciate ligaments both were in satisfactory condition as well.  Complications:   None  EBL:   5 cc.  Total fluids:   1500 cc of crystalloid.  Tourniquet time:   None  Drains:   None  Closure:   4-0 Prolene interrupted sutures.  Brief clinical note:   The patient is a 45 year old male with a long history of lateral sided right knee pain with recurrent effusions. His symptoms have persisted despite medications, activity modification, numerous steroid injections/aspirations, etc. His history examination are consistent with degenerative joint disease and lateral meniscus tear, all of which were confirmed by a preoperative MRI scan. The patient presents at this time for arthroscopy, debridement, and partial lateral meniscectomy.  Procedure:   The patient was brought into the operating room and lain in the supine position. After adequate general laryngeal mask anesthesia was obtained, a timeout was performed to verify the appropriate side. The patient's right knee was injected sterilely using a solution of  30 cc of 1% lidocaine and 30 cc of 0.5% Sensorcaine with epinephrine. The right lower extremity was prepped with ChloraPrep solution before being draped sterilely. Preoperative antibiotics were administered. The expected portal sites were injected with 0.5% Sensorcaine with epinephrine before the camera was placed in the anterolateral portal and instrumentation performed through the anteromedial portal.   The knee was sequentially examined beginning in the suprapatellar pouch, then progressing to the patellofemoral space, the medial gutter and compartment, the notch, and finally the lateral compartment and gutter. The findings were as described above. Abundant reactive synovial tissues anteriorly were debrided using the full-radius resector in order to improve visualization. There was a complex tear involving the posterior portion of the lateral meniscus with a large unstable flap component. This portion of the meniscus was debrided back to stable margins using the full-radius resector, meniscal scissors, and ArthroCare wand. The remaining rim was probed and found to be stable.    The area of grade III chondromalacia involving the femoral trochlea was debrided back to stable margins using the full-radius resector. Areas of grade II-III chondromalacia involving the medial and lateral femoral condyles as well as the lateral tibial plateau also were debrided back to stable margins using the full-radius resector.   Finally, an extensive synovectomy was performed in the anterior, medial, and lateral portions of the knee with debridement of small osteophytes along the medial border of the medial femoral condyle. The instruments were removed from the joint after suctioning the excess fluid.   The portal sites were closed using 4-0 Prolene interrupted sutures before a sterile bulky dressing was applied to the knee. The patient was then awakened, extubated, and returned to the recovery room in  satisfactory condition  after tolerating the procedure well.

## 2021-11-05 NOTE — Anesthesia Preprocedure Evaluation (Addendum)
Anesthesia Evaluation  Patient identified by MRN, date of birth, ID band Patient awake    Reviewed: Allergy & Precautions, NPO status , Patient's Chart, lab work & pertinent test results, reviewed documented beta blocker date and time   History of Anesthesia Complications Negative for: history of anesthetic complications  Airway Mallampati: II  TM Distance: >3 FB Neck ROM: Full    Dental  (+) Poor Dentition, Missing   Pulmonary asthma , Current SmokerPatient did not abstain from smoking.,    breath sounds clear to auscultation- rhonchi (-) wheezing      Cardiovascular Exercise Tolerance: Good hypertension, Pt. on medications and Pt. on home beta blockers (-) CAD, (-) Past MI, (-) Cardiac Stents and (-) CABG  Rhythm:Regular Rate:Normal - Systolic murmurs and - Diastolic murmurs    Neuro/Psych  Headaches, neg Seizures PSYCHIATRIC DISORDERS Anxiety Depression Bipolar Disorder  Neuromuscular disease    GI/Hepatic Neg liver ROS, GERD  Medicated and Controlled,  Endo/Other  negative endocrine ROSneg diabetes  Renal/GU negative Renal ROS     Musculoskeletal  (+) Arthritis , Fibromyalgia -, narcotic dependent  Abdominal (+) + obese,   Peds  Hematology  (+) anemia ,   Anesthesia Other Findings Allergic genetic state    Anemia    Anxiety    Arthritis    Asthma    Bipolar disorder (HCC)    Chronic pain    DDD (degenerative disc disease), lumbar   Depression    Fibromyalgia    GERD (gastroesophageal reflux disease)   H/O gastric bypass    Headache  migraines, severe since 2009. 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  Obesity    Polypharmacy    Stress fracture of metatarsal bone with routine healing Thyrotoxicosis    Tremor    Ulnar neuropathy       Reproductive/Obstetrics                            Anesthesia Physical  Anesthesia Plan  ASA: 3  Anesthesia Plan: General    Post-op Pain Management:    Induction: Intravenous  PONV Risk Score and Plan: 2 and Propofol infusion, Ondansetron and Midazolam  Airway Management Planned: LMA  Additional Equipment:   Intra-op Plan:   Post-operative Plan: Extubation in OR  Informed Consent: I have reviewed the patients History and Physical, chart, labs and discussed the procedure including the risks, benefits and alternatives for the proposed anesthesia with the patient or authorized representative who has indicated his/her understanding and acceptance.       Plan Discussed with: CRNA, Anesthesiologist and Surgeon  Anesthesia Plan Comments:       Anesthesia Quick Evaluation

## 2021-11-05 NOTE — Transfer of Care (Signed)
Immediate Anesthesia Transfer of Care Note  Patient: Spencer Rivera  Procedure(s) Performed: RIGHT KNEE ARTHROSCOPY WITH DEBRIDEMENT AND PARTIAL LATERAL MENISCECTOMY, ABRASION CHONDROPLASTY OF THE FEMORAL TROCHLEA, EXTENSIVE SYNOVECTOMY (Right: Knee)  Patient Location: PACU  Anesthesia Type:General  Level of Consciousness: sedated  Airway & Oxygen Therapy: Patient Spontanous Breathing and Patient connected to face mask oxygen  Post-op Assessment: Report given to RN and Post -op Vital signs reviewed and stable  Post vital signs: Reviewed and stable  Last Vitals:  Vitals Value Taken Time  BP 103/63 11/05/21 1706  Temp    Pulse 72 11/05/21 1709  Resp 12 11/05/21 1709  SpO2 100 % 11/05/21 1709  Vitals shown include unvalidated device data.  Last Pain:  Vitals:   11/05/21 1352  TempSrc: Tympanic  PainSc: 0-No pain         Complications: No notable events documented.

## 2021-11-05 NOTE — H&P (Signed)
Subjective:  Chief complaint:  Right knee pain.  The patient is a 45 y.o. male who presents today to undergo a right knee arthroscopy with debridement and partial lateral meniscectomy.  The patient is scheduled to undergo this procedure with Dr. Roland Rack today, 11/05/21.  Patient denies any changes in his medical history since he was last evaluated at [facility].  He denies any trauma or injury since he was last evaluated.  Does have a history of chronic pain.  Denies any personal history of MI, DVT, Stroke or COPD.  Does report a history of Asthma.  Patient Active Problem List   Diagnosis Date Noted   Small bowel laceration 10/04/2018   Past Medical History:  Diagnosis Date   Allergic genetic state    Anemia    Anxiety    Arthritis    Asthma    Bipolar disorder (HCC)    Chronic pain    DDD (degenerative disc disease), lumbar    Depression    Fibromyalgia    GERD (gastroesophageal reflux disease)    H/O gastric bypass    Headache    migraines, severe since 2009. chronic with minimal relief   Hearing loss    r>l   Obesity    Polypharmacy    Stress fracture of metatarsal bone with routine healing    Thyrotoxicosis    Tremor    Ulnar neuropathy     Past Surgical History:  Procedure Laterality Date   articular cartilage     BARIATRIC SURGERY  2010   BOWEL RESECTION  10/04/2018   Procedure: SMALL BOWEL RESECTION WITH ANASTAMOSIS;  Surgeon: Herbert Pun, MD;  Location: [facility] ORS;  Service: General;;   CHOLECYSTECTOMY N/A 10/04/2018   Procedure: LAPAROSCOPIC CHOLECYSTECTOMY;  Surgeon: Herbert Pun, MD;  Location: [facility] ORS;  Service: General;  Laterality: N/A;   COLONOSCOPY  06/21/2016   ESOPHAGOGASTRODUODENOSCOPY  02/09/2016   ESOPHAGOGASTRODUODENOSCOPY (EGD) WITH PROPOFOL N/A 09/21/2018   Procedure: ESOPHAGOGASTRODUODENOSCOPY (EGD) WITH PROPOFOL;  Surgeon: Lollie Sails, MD;  Location: [facility] ENDOSCOPY;  Service: Endoscopy;  Laterality: N/A;    HERNIA REPAIR     KNEE ARTHROSCOPY Right 04/21/2017   LAPAROSCOPIC GASTRIC BYPASS     NEUROPLASTY / TRANSPOSITION MEDIAN NERVE AT CARPAL TUNNEL     Right radial artery  2002   SHOULDER ARTHROSCOPY WITH SUBACROMIAL DECOMPRESSION, ROTATOR CUFF REPAIR AND BICEP TENDON REPAIR Right 03/03/2021   Procedure: RIGHT SHOULDER ARTHROSCOPY WITH DEBRIDEMENT, DECOMPRESSION, ARTHROSCOPIC DISTAL CLAVICLE EXCISION, POSSIBLE SLAP REPAIR, POSSIBLE CLAP REPAIR, ROTATOR CUFF REPAIR, AND BICEPS TENODESIS.;  Surgeon: Corky Mull, MD;  Location: [facility] ORS;  Service: Orthopedics;  Laterality: Right;   VASCULAR SURGERY  2012   venous ablation surgery    Medications Prior to Admission  Medication Sig Dispense Refill Last Dose   Adalimumab (HUMIRA PEN) 40 MG/0.4ML PNKT Inject 40 mg into the skin once a week. Friday   Past Week   ADDERALL XR 30 MG 24 hr capsule Take 30 mg by mouth every morning.   11/04/2021   albuterol (PROVENTIL HFA;VENTOLIN HFA) 108 (90 Base) MCG/ACT inhaler Inhale 2 puffs into the lungs every 4 (four) hours as needed for wheezing or shortness of breath.   Past Month   albuterol (PROVENTIL) (2.5 MG/3ML) 0.083% nebulizer solution Take 2.5 mg by nebulization every 4 (four) hours as needed for wheezing or shortness of breath.   Past Month   ARIPiprazole (ABILIFY) 10 MG tablet Take 10 mg by mouth daily.   11/05/2021   Atogepant (QULIPTA)  60 MG TABS Take 60 mg by mouth every evening.   11/04/2021   baclofen (LIORESAL) 20 MG tablet Take 20 mg by mouth 3 (three) times daily.   11/05/2021   ciclopirox (PENLAC) 8 % solution Apply 1 application topically at bedtime as needed (for nail fungus). Apply over nail and surrounding skin. Apply daily over previous coat. After seven (7) days, may remove with alcohol and continue cycle.   Past Week   cyanocobalamin (,VITAMIN B-12,) 1000 MCG/ML injection Inject 1,000 mcg into the muscle every 14 (fourteen) days. Sunday   Past Week   desvenlafaxine (PRISTIQ) 100 MG 24 hr  tablet Take 100 mg by mouth every evening.   11/04/2021   Dexlansoprazole 30 MG capsule Take 30 mg by mouth daily before breakfast.   11/05/2021   diclofenac (VOLTAREN) 50 MG EC tablet Take 50 mg by mouth 2 (two) times daily.   11/04/2021   diphenhydrAMINE (BENADRYL) 25 mg capsule Take 50 mg by mouth every 6 (six) hours as needed (severe migraine pain).   11/04/2021   docusate sodium (COLACE) 100 MG capsule Take 100 mg by mouth 2 (two) times daily.   11/04/2021   famotidine (PEPCID) 40 MG tablet Take 40 mg by mouth at bedtime. 1 hour before bedtime   11/05/2021   ferrous sulfate 325 (65 FE) MG tablet Take 325 mg by mouth in the morning and at bedtime.   11/04/2021   gabapentin (NEURONTIN) 600 MG tablet Take 1,200 mg by mouth 3 (three) times daily.   11/05/2021   ketoconazole (NIZORAL) 2 % shampoo Apply 1 application topically 2 (two) times a week. As needed for itching      Lemborexant (DAYVIGO) 10 MG TABS Take 10 mg by mouth at bedtime.   11/04/2021   levocetirizine (XYZAL) 5 MG tablet Take 5 mg by mouth at bedtime.   11/04/2021   metFORMIN (GLUCOPHAGE-XR) 500 MG 24 hr tablet Take 1,000 mg by mouth daily with supper.   Past Week   metoprolol succinate (TOPROL-XL) 25 MG 24 hr tablet Take 25 mg by mouth in the morning.   11/05/2021   morphine (MSIR) 30 MG tablet Take 1 tablet (30 mg total) by mouth every 4 (four) hours as needed for moderate pain or severe pain. (Patient taking differently: Take 30 mg by mouth every 6 (six) hours as needed for moderate pain or severe pain.) 30 tablet 0 11/05/2021   Multiple Vitamins-Minerals (MULTI FOR HIM 50+) TABS Take 1 tablet by mouth daily.   11/04/2021   mupirocin ointment (BACTROBAN) 2 % Apply 1 application topically daily as needed (for sore in ears.).    11/04/2021   Naldemedine Tosylate (SYMPROIC) 0.2 MG TABS Take 0.2 mg by mouth every evening.   Past Month   neomycin-polymyxin-hydrocortisone (CORTISPORIN) OTIC solution Place 3 drops into both ears 4 (four)  times daily as needed (itching).   11/04/2021   NUCYNTA ER 200 MG TB12 Take 200 mg by mouth 2 (two) times daily.   11/04/2021   Olopatadine HCl 0.2 % SOLN Place 1 drop into both eyes in the morning.   11/04/2021   pantoprazole (PROTONIX) 40 MG tablet Take 40 mg by mouth 2 (two) times daily before a meal.   11/04/2021   promethazine (PHENERGAN) 25 MG tablet Take 25 mg by mouth every 8 (eight) hours as needed for nausea or vomiting.   11/04/2021   Prucalopride Succinate (MOTEGRITY) 1 MG TABS Take 1 mg by mouth daily.   Past Week  QUEtiapine (SEROQUEL) 100 MG tablet Take 200 mg by mouth at bedtime as needed (for migraine).   11/04/2021   sucralfate (CARAFATE) 1 GM/10ML suspension Take 1 g by mouth 2 (two) times daily before a meal.   11/04/2021   tadalafil (CIALIS) 5 MG tablet Take 5 mg by mouth in the morning.   11/04/2021   testosterone cypionate (DEPOTESTOSTERONE CYPIONATE) 200 MG/ML injection Inject 0.375 mLs into the muscle once a week. Friday   Past Week   traZODone (DESYREL) 100 MG tablet Take 200 mg by mouth at bedtime.   11/04/2021   vitamin B-6 (PYRIDOXINE) 25 MG tablet Take 25 mg by mouth in the morning.   11/04/2021   Vitamin D, Ergocalciferol, (DRISDOL) 1.25 MG (50000 UT) CAPS capsule Take 50,000 Units by mouth 2 (two) times a week. Tuesdays & Thursday   11/04/2021   WAL-ZYR D 5-120 MG tablet Take 1 tablet by mouth 2 (two) times daily.   11/04/2021   zolmitriptan (ZOMIG-ZMT) 5 MG disintegrating tablet Take 5 mg by mouth every 2 (two) hours as needed for migraine.   11/04/2021   cyclobenzaprine (FLEXERIL) 10 MG tablet Take 10 mg by mouth 3 (three) times daily as needed (severe migraines). (Patient not taking: Reported on 11/05/2021)   Not Taking   Naloxone HCl (NARCAN NA) Place 4 mg into the nose as needed (accidental opoid overdose).  (Patient not taking: Reported on 11/05/2021)   Not Taking   Allergies  Allergen Reactions   Belsomra [Suvorexant] Anaphylaxis    Hallucinations     Etodolac Itching, Swelling and Rash    Swelling of throat   Ipratropium Swelling    Swelling of throat   Methocarbamol Itching, Swelling and Rash    Swelling of throat   Prednisone Hives, Itching, Swelling and Rash    Swelling of throat, hives   Ciprofloxacin Hives   Lamotrigine Hives   Latex Rash    Has not had a RAST test. Causes burns on skin. Adhesives seem to cause same problem   Mometasone Furo-Formoterol Fum Hives    (Dulera)   Paroxetine Hcl Itching, Swelling and Rash    Mood changes (violatile behaviors)    Topiramate Other (See Comments)    myoclonic jerks Was using for migraines   Bactrim [Sulfamethoxazole-Trimethoprim] Rash   Lactose Intolerance (Gi) Diarrhea    Upset stomach   Naproxen Sodium Rash   Pregabalin Rash   Trintellix [Vortioxetine] Anxiety    Social History   Tobacco Use   Smoking status: Heavy Smoker    Packs/day: 1.00    Years: 12.50    Pack years: 12.50    Types: Cigarettes   Smokeless tobacco: Never  Substance Use Topics   Alcohol use: Yes    Alcohol/week: 1.0 standard drink    Types: 1 Shots of liquor per week    Comment: occas.    Family History  Problem Relation Age of Onset   Depression Father    Lung cancer Father    Sleep apnea Father    Alcohol abuse Maternal Uncle    Cancer Paternal Grandfather    Diabetes Paternal Grandmother    Osteoarthritis Paternal Grandmother    Stroke Maternal Grandmother    Diabetes Maternal Grandmother    Glaucoma Maternal Grandmother    Osteoporosis Maternal Grandmother      Review of Systems: As noted above. The patient denies any chest pain, shortness of breath, nausea, vomiting, diarrhea, constipation, belly pain, blood in his/her stool, or burning with urination.  Objective: Temp:  [98.5 F (36.9 C)] 98.5 F (36.9 C) (12/29 1352) Pulse Rate:  [101] 101 (12/29 1352) Resp:  [18] 18 (12/29 1352) BP: (152)/(82) 152/82 (12/29 1352) SpO2:  [96 %] 96 % (12/29 1352) Weight:  [102.1 kg] 102.1  kg (12/29 1352)  Physical Exam: General:  Alert, no acute distress Psychiatric:  Patient is competent for consent with normal mood and affect Cardiovascular:  RRR  Respiratory:  Clear to auscultation. No wheezing. Non-labored breathing GI:  Abdomen is soft and non-tender Skin:  No lesions in the area of chief complaint Neurologic:  Sensation intact distally Lymphatic:  No axillary or cervical lymphadenopathy  Orthopedic Exam:  Right knee exam: The patient ambulates with a mild limp, but is not using any assistive devices. Skin inspection of the right knee is notable for well-healed arthroscopic portal sites, as well as mild swelling diffusely around the knee, as well as a 1+ effusion. No erythema, ecchymosis, abrasions, or other skin abnormalities identified. He has moderate tenderness to palpation along the lateral joint line and over the lateral tibiofibular joint, but only mild tenderness along the medial joint line. Actively, he is able to extend his knee to 0 degrees and flex to 120 degrees with mild discomfort at the extremes of flexion and extension. His patella tracks well and is notable for minimal crepitance. The knee is stable to varus and valgus stressing, although there may be mild pseudolaxity to valgus stressing. Grossly, he is neurovascularly intact to the right lower extremity and foot.  Imaging Review: MRI scan of the right knee was obtained on 10/09/21.  Per the report, patient has a complex displaced flap tear of the posterior horn of the lateral meniscus with 1 cm meniscal fragment displaced into the lateral meniscofemoral recess.  He has post-arthroscopic scarring in Hoffa's fat pad with a moderate knee joint effusion.  Mild-to-moderate lateral compartment osteoarthritis with grade III chondromalacia and mild subchondral marrow edema and subchondral impaction of the medial aspect of the lateral femoral condyle.  Assessment: Right Knee osteoarthritis Lateral meniscus  tear  Plan:  The treatment options were discussed today with the patient. Risks and benefits of a right knee arthroscopy with debridement and partial lateral meniscectomy were discussed the patient.  Patient would like to proceed with surgery with Dr. Roland Rack at this time. This document will serve as the surgical history and physical for the patient. He will follow-up per standard post-op protocol.  The risks (including bleeding, infection, nerve and/or blood vessel injury, persistent or recurrent pain, failure of the repair, advancement of arthritic changes, need for further surgery, blood clots, strokes, heart attacks or arrhythmias, pneumonia, etc.) and benefits of surgery were discussed. The patient states his understanding and agrees to proceed. He agrees to a blood transfusion if necessary. A consent will be signed at the time of surgery.   Raquel Shalon Salado, PA-C [place]

## 2021-11-05 NOTE — Discharge Instructions (Addendum)
Orthopedic discharge instructions: Keep dressing dry and intact.  May shower after dressing changed on post-op day #4 (Monday).  Cover sutures with Band-Aids after drying off. Apply ice frequently to knee. Resume Voltaren 50 mg twice daily with food tonight. Take pain medication as prescribed or ES Tylenol when needed.  May weight-bear as tolerated - use crutches or walker as needed. Follow-up in 10-14 days or as scheduled.AMBULATORY SURGERY  DISCHARGE INSTRUCTIONS   The drugs that you were given will stay in your system until tomorrow so for the next 24 hours you should not:  Drive an automobile Make any legal decisions Drink any alcoholic beverage   You may resume regular meals tomorrow.  Today it is better to start with liquids and gradually work up to solid foods.  You may eat anything you prefer, but it is better to start with liquids, then soup and crackers, and gradually work up to solid foods.   Please notify your doctor immediately if you have any unusual bleeding, trouble breathing, redness and pain at the surgery site, drainage, fever, or pain not relieved by medication.     Your post-operative visit with Dr.                                       is: Date:                        Time:    Please call to schedule your post-operative visit.  Additional Instructions:

## 2021-11-05 NOTE — Anesthesia Procedure Notes (Signed)
Procedure Name: LMA Insertion Date/Time: 11/05/2021 3:54 PM Performed by: Esaw Grandchild, CRNA Pre-anesthesia Checklist: Patient identified, Emergency Drugs available, Suction available and Patient being monitored Patient Re-evaluated:Patient Re-evaluated prior to induction Oxygen Delivery Method: Circle system utilized Preoxygenation: Pre-oxygenation with 100% oxygen Induction Type: IV induction Ventilation: Mask ventilation without difficulty LMA: LMA inserted LMA Size: 5.0 Tube type: Oral Number of attempts: 1 Placement Confirmation: positive ETCO2 and breath sounds checked- equal and bilateral Tube secured with: Tape Dental Injury: Teeth and Oropharynx as per pre-operative assessment

## 2021-11-06 ENCOUNTER — Encounter: Payer: Self-pay | Admitting: Surgery
